# Patient Record
Sex: Female | Born: 1950 | Race: White | Hispanic: No | Smoking: Never smoker
Health system: Southern US, Community
[De-identification: ages and names within clinical notes are randomized; demographics above are authoritative.]

## PROBLEM LIST (undated history)

## (undated) DIAGNOSIS — F32A Depression, unspecified: Secondary | ICD-10-CM

## (undated) DIAGNOSIS — M1990 Unspecified osteoarthritis, unspecified site: Secondary | ICD-10-CM

## (undated) DIAGNOSIS — F329 Major depressive disorder, single episode, unspecified: Secondary | ICD-10-CM

## (undated) DIAGNOSIS — Z8489 Family history of other specified conditions: Secondary | ICD-10-CM

## (undated) DIAGNOSIS — I10 Essential (primary) hypertension: Secondary | ICD-10-CM

## (undated) DIAGNOSIS — F419 Anxiety disorder, unspecified: Secondary | ICD-10-CM

## (undated) DIAGNOSIS — K219 Gastro-esophageal reflux disease without esophagitis: Secondary | ICD-10-CM

## (undated) DIAGNOSIS — Z8719 Personal history of other diseases of the digestive system: Secondary | ICD-10-CM

## (undated) DIAGNOSIS — R011 Cardiac murmur, unspecified: Secondary | ICD-10-CM

## (undated) DIAGNOSIS — M797 Fibromyalgia: Secondary | ICD-10-CM

## (undated) HISTORY — PX: COLONOSCOPY: SHX174

## (undated) HISTORY — PX: ABDOMINAL HYSTERECTOMY: SHX81

## (undated) HISTORY — PX: EYE SURGERY: SHX253

## (undated) HISTORY — PX: CHOLECYSTECTOMY: SHX55

---
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
## 2018-04-09 ENCOUNTER — Other Ambulatory Visit: Payer: Self-pay | Admitting: Neurosurgery

## 2018-04-24 NOTE — Pre-Procedure Instructions (Signed)
Amber Snyder  04/24/2018     No Pharmacies Listed   Your procedure is scheduled on Dec. 19  [facility] at 12: 35 P.M.  Call this number if you have problems the morning of surgery:  [phone]   Remember:  Do not eat or drink after midnight.      Take these medicines the morning of surgery with A SIP OF WATER :    Do not wear jewelry, make-up or nail polish.  Do not wear lotions, powders, or perfumes, or deodorant.  Do not shave 48 hours prior to surgery.  Men may shave face and neck.  Do not bring valuables to the hospital.  [facility] is not responsible for any belongings or valuables.  Contacts, dentures or bridgework may not be worn into surgery.  Leave your suitcase in the car.  After surgery it may be brought to your room.  For patients admitted to the hospital, discharge time will be determined by your treatment team.  Patients discharged the day of surgery will not be allowed to drive home.    Special instructions:  [place]- Preparing For Surgery  Before surgery, you can play an important role. Because skin is not sterile, your skin needs to be as free of germs as possible. You can reduce the number of germs on your skin by washing with CHG (chlorahexidine gluconate) Soap before surgery.  CHG is an antiseptic cleaner which kills germs and bonds with the skin to continue killing germs even after washing.    Oral Hygiene is also important to reduce your risk of infection.  Remember - BRUSH YOUR TEETH THE MORNING OF SURGERY WITH YOUR REGULAR TOOTHPASTE  Please do not use if you have an allergy to CHG or antibacterial soaps. If your skin becomes reddened/irritated stop using the CHG.  Do not shave (including legs and underarms) for at least 48 hours prior to first CHG shower. It is OK to shave your face.  Please follow these instructions carefully.   1. Shower the NIGHT BEFORE SURGERY and the MORNING OF SURGERY with CHG.    2. If you chose to wash your hair, wash your hair first as usual with your normal shampoo.  3. After you shampoo, rinse your hair and body thoroughly to remove the shampoo.  4. Use CHG as you would any other liquid soap. You can apply CHG directly to the skin and wash gently with a scrungie or a clean washcloth.   5. Apply the CHG Soap to your body ONLY FROM THE NECK DOWN.  Do not use on open wounds or open sores. Avoid contact with your eyes, ears, mouth and genitals (private parts). Wash Face and genitals (private parts)  with your normal soap.  6. Wash thoroughly, paying special attention to the area where your surgery will be performed.  7. Thoroughly rinse your body with warm water from the neck down.  8. DO NOT shower/wash with your normal soap after using and rinsing off the CHG Soap.  9. Pat yourself dry with a CLEAN TOWEL.  10. Wear CLEAN PAJAMAS to bed the night before surgery, wear comfortable clothes the morning of surgery  11. Place CLEAN SHEETS on your bed the night of your first shower and DO NOT SLEEP WITH PETS.    Day of Surgery:  Do not apply any deodorants/lotions.  Please wear clean clothes to the hospital/surgery center.   Remember to brush your teeth WITH YOUR  REGULAR TOOTHPASTE.    Please read over the following fact sheets that you were given. Coughing and Deep Breathing, MRSA Information and Surgical Site Infection Prevention

## 2018-04-25 ENCOUNTER — Other Ambulatory Visit: Payer: Self-pay

## 2018-04-25 ENCOUNTER — Encounter (HOSPITAL_COMMUNITY)
Admission: RE | Admit: 2018-04-25 | Discharge: 2018-04-25 | Disposition: A | Discharge status: Home / Self Care | Payer: Medicare Other | Source: Ambulatory Visit | Attending: Neurosurgery | Admitting: Neurosurgery

## 2018-04-25 ENCOUNTER — Encounter (HOSPITAL_COMMUNITY): Payer: Self-pay | Admitting: Urology

## 2018-04-25 DIAGNOSIS — I119 Hypertensive heart disease without heart failure: Secondary | ICD-10-CM | POA: Insufficient documentation

## 2018-04-25 DIAGNOSIS — Z01818 Encounter for other preprocedural examination: Secondary | ICD-10-CM | POA: Insufficient documentation

## 2018-04-25 HISTORY — DX: Cardiac murmur, unspecified: R01.1

## 2018-04-25 HISTORY — DX: Family history of other specified conditions: Z84.89

## 2018-04-25 HISTORY — DX: Gastro-esophageal reflux disease without esophagitis: K21.9

## 2018-04-25 HISTORY — DX: Essential (primary) hypertension: I10

## 2018-04-25 HISTORY — DX: Personal history of other diseases of the digestive system: Z87.19

## 2018-04-25 LAB — BASIC METABOLIC PANEL
Anion gap: 12 (ref 5–15)
BUN: 17 mg/dL (ref 8–23)
CO2: 29 mmol/L (ref 22–32)
Calcium: 8.7 mg/dL — ABNORMAL LOW (ref 8.9–10.3)
Chloride: 97 mmol/L — ABNORMAL LOW (ref 98–111)
Creatinine, Ser: 0.78 mg/dL (ref 0.44–1.00)
GFR calc Af Amer: >60 mL/min (ref >60)
GFR calc non Af Amer: >60 mL/min (ref >60)
Glucose, Bld: 93 mg/dL (ref 70–99)
Potassium: 3 mmol/L — ABNORMAL LOW (ref 3.5–5.1)
Sodium: 138 mmol/L (ref 135–145)

## 2018-04-25 LAB — TYPE AND SCREEN
ABO/RH(D): O POS
Antibody Screen: NEGATIVE

## 2018-04-25 LAB — CBC
HCT: 45.1 % (ref 36.0–46.0)
Hemoglobin: 14.7 g/dL (ref 12.0–15.0)
MCH: 27.7 pg (ref 26.0–34.0)
MCHC: 32.6 g/dL (ref 30.0–36.0)
MCV: 85.1 fL (ref 80.0–100.0)
Platelets: 325 10*3/uL (ref 150–400)
RBC: 5.3 MIL/uL — ABNORMAL HIGH (ref 3.87–5.11)
RDW: 14.1 % (ref 11.5–15.5)
WBC: 7.4 10*3/uL (ref 4.0–10.5)
nRBC: 0 % (ref 0.0–0.2)

## 2018-04-25 LAB — SURGICAL PCR SCREEN
MRSA, PCR: NEGATIVE
Staphylococcus aureus: POSITIVE — AB

## 2018-04-25 LAB — ABO/RH: ABO/RH(D): O POS

## 2018-04-25 NOTE — Progress Notes (Signed)
[facility] Cardiologist - denies  Chest x-ray - 04/16/18 in care everywhere EKG - 04/25/2018   Pt states she had a cold a couple weeks ago and was on cough medicine and azithromycin. Pt states her cough has gotten better, but she still has a cough and coughs up some mucus. Pt states she will call her PCP about this and if it gets worse/ does not get better she will let Dr. Lovell SheehanJenkins know as well.   Patient denies shortness of breath, fever, and chest pain at PAT appointment   Patient verbalized understanding of instructions that were given to them at the PAT appointment. Patient was also instructed that they will need to review over the PAT instructions again at home before surgery.

## 2018-04-25 NOTE — Pre-Procedure Instructions (Signed)
Amber Snyder  04/25/2018      [facility] [name] - [name] [id number] Phone: [phone] Fax: [phone]    Your procedure is scheduled on Dec. 19  [facility] at 12:30 P.M.  Call this number if you have problems the morning of surgery:  [phone]   Remember:  Do not eat or drink after midnight.      Take these medicines the morning of surgery with A SIP OF WATER :  Amlodipine (norvasc) Esomeprazole (Nexium) Hydrocodone-Acetaminophen (Norco) if needed  7 days prior to surgery STOP taking any meloxicam (mobic), Aspirin(unless otherwise instructed by your surgeon), Aleve, Naproxen, Ibuprofen, Motrin, Advil, Goody's, BC's, all herbal medications, fish oil, and all vitamins    Do not wear jewelry, make-up or nail polish.  Do not wear lotions, powders, or perfumes, or deodorant.  Do not shave 48 hours prior to surgery.  Men may shave face and neck.  Do not bring valuables to the hospital.  [facility] responsible for any belongings or valuables.  Contacts, dentures or bridgework may not be worn into surgery.  Leave your suitcase in the car.  After surgery it may be brought to your room.  For patients admitted to the hospital, discharge time will be determined by your treatment team.  Patients discharged the day of surgery will not be allowed to drive home.    Special instructions:  [place]- Preparing For Surgery  Before surgery, you can play an important role. Because skin is not sterile, your skin needs to be as free of germs as possible. You can reduce the number of germs on your skin by washing with CHG (chlorahexidine gluconate) Soap before surgery.  CHG is an antiseptic cleaner which kills germs and bonds with the skin to continue killing germs even after washing.    Oral Hygiene is also important to reduce your risk of infection.  Remember - BRUSH YOUR TEETH THE  MORNING OF SURGERY WITH YOUR REGULAR TOOTHPASTE  Please do not use if you have an allergy to CHG or antibacterial soaps. If your skin becomes reddened/irritated stop using the CHG.  Do not shave (including legs and underarms) for at least 48 hours prior to first CHG shower. It is OK to shave your face.  Please follow these instructions carefully.   1. Shower the NIGHT BEFORE SURGERY and the MORNING OF SURGERY with CHG.   2. If you chose to wash your hair, wash your hair first as usual with your normal shampoo.  3. After you shampoo, rinse your hair and body thoroughly to remove the shampoo.  4. Use CHG as you would any other liquid soap. You can apply CHG directly to the skin and wash gently with a scrungie or a clean washcloth.   5. Apply the CHG Soap to your body ONLY FROM THE NECK DOWN.  Do not use on open wounds or open sores. Avoid contact with your eyes, ears, mouth and genitals (private parts). Wash Face and genitals (private parts)  with your normal soap.  6. Wash thoroughly, paying special attention to the area where your surgery will be performed.  7. Thoroughly rinse your body with warm water from the neck down.  8. DO NOT shower/wash with your normal soap after using and rinsing off the CHG Soap.  9. Pat yourself dry with a CLEAN TOWEL.  10. Wear CLEAN PAJAMAS to bed  the night before surgery, wear comfortable clothes the morning of surgery  11. Place CLEAN SHEETS on your bed the night of your first shower and DO NOT SLEEP WITH PETS.    Day of Surgery:  Do not apply any deodorants/lotions.  Please wear clean clothes to the hospital/surgery center.   Remember to brush your teeth WITH YOUR REGULAR TOOTHPASTE.    Please read over the following fact sheets that you were given. Coughing and Deep Breathing, MRSA Information and Surgical Site Infection Prevention

## 2018-04-30 ENCOUNTER — Other Ambulatory Visit: Payer: Self-pay | Admitting: Neurosurgery

## 2018-05-02 ENCOUNTER — Other Ambulatory Visit: Payer: Self-pay

## 2018-05-02 ENCOUNTER — Encounter (HOSPITAL_COMMUNITY): Payer: Self-pay

## 2018-05-02 ENCOUNTER — Encounter (HOSPITAL_COMMUNITY)
Admission: RE | Disposition: A | Discharge status: Home / Self Care | Payer: Self-pay | Source: Home / Self Care | Attending: Neurosurgery

## 2018-05-02 ENCOUNTER — Observation Stay (HOSPITAL_COMMUNITY)
Admission: RE | Admit: 2018-05-02 | Discharge: 2018-05-03 | Disposition: A | Discharge status: Home / Self Care | Payer: Medicare Other | Attending: Neurosurgery | Admitting: Neurosurgery

## 2018-05-02 ENCOUNTER — Ambulatory Visit (HOSPITAL_COMMUNITY): Payer: Medicare Other

## 2018-05-02 ENCOUNTER — Ambulatory Visit (HOSPITAL_COMMUNITY): Payer: Medicare Other | Admitting: Physician Assistant

## 2018-05-02 ENCOUNTER — Ambulatory Visit (HOSPITAL_COMMUNITY): Payer: Medicare Other | Admitting: Certified Registered"

## 2018-05-02 DIAGNOSIS — M4802 Spinal stenosis, cervical region: Secondary | ICD-10-CM | POA: Insufficient documentation

## 2018-05-02 DIAGNOSIS — K449 Diaphragmatic hernia without obstruction or gangrene: Secondary | ICD-10-CM | POA: Insufficient documentation

## 2018-05-02 DIAGNOSIS — Z9049 Acquired absence of other specified parts of digestive tract: Secondary | ICD-10-CM | POA: Diagnosis not present

## 2018-05-02 DIAGNOSIS — R011 Cardiac murmur, unspecified: Secondary | ICD-10-CM | POA: Diagnosis not present

## 2018-05-02 DIAGNOSIS — K219 Gastro-esophageal reflux disease without esophagitis: Secondary | ICD-10-CM | POA: Insufficient documentation

## 2018-05-02 DIAGNOSIS — Z885 Allergy status to narcotic agent status: Secondary | ICD-10-CM | POA: Diagnosis not present

## 2018-05-02 DIAGNOSIS — Z90710 Acquired absence of both cervix and uterus: Secondary | ICD-10-CM | POA: Insufficient documentation

## 2018-05-02 DIAGNOSIS — Z7951 Long term (current) use of inhaled steroids: Secondary | ICD-10-CM | POA: Insufficient documentation

## 2018-05-02 DIAGNOSIS — Z79899 Other long term (current) drug therapy: Secondary | ICD-10-CM | POA: Diagnosis not present

## 2018-05-02 DIAGNOSIS — J449 Chronic obstructive pulmonary disease, unspecified: Secondary | ICD-10-CM | POA: Diagnosis not present

## 2018-05-02 DIAGNOSIS — M4722 Other spondylosis with radiculopathy, cervical region: Secondary | ICD-10-CM | POA: Diagnosis not present

## 2018-05-02 DIAGNOSIS — M50121 Cervical disc disorder at C4-C5 level with radiculopathy: Secondary | ICD-10-CM | POA: Diagnosis present

## 2018-05-02 DIAGNOSIS — Z791 Long term (current) use of non-steroidal anti-inflammatories (NSAID): Secondary | ICD-10-CM | POA: Insufficient documentation

## 2018-05-02 DIAGNOSIS — Z419 Encounter for procedure for purposes other than remedying health state, unspecified: Secondary | ICD-10-CM

## 2018-05-02 DIAGNOSIS — Z6841 Body Mass Index (BMI) 40.0 and over, adult: Secondary | ICD-10-CM | POA: Diagnosis not present

## 2018-05-02 DIAGNOSIS — I10 Essential (primary) hypertension: Secondary | ICD-10-CM | POA: Diagnosis not present

## 2018-05-02 HISTORY — PX: ANTERIOR CERVICAL DECOMP/DISCECTOMY FUSION: SHX1161

## 2018-05-02 SURGERY — ANTERIOR CERVICAL DECOMPRESSION/DISCECTOMY FUSION 1 LEVEL
Anesthesia: General

## 2018-05-02 MED ORDER — PROMETHAZINE HCL 25 MG/ML IJ SOLN: 6.2500 mg | INTRAMUSCULAR | Status: DC | PRN

## 2018-05-02 MED ORDER — PROPOFOL 10 MG/ML IV BOLUS
INTRAVENOUS | Status: DC | PRN
  Administered 2018-05-02 (×2): 100 mg via INTRAVENOUS

## 2018-05-02 MED ORDER — SODIUM CHLORIDE 0.9 % IV SOLN
INTRAVENOUS | Status: DC | PRN
  Administered 2018-05-02: 16:00:00

## 2018-05-02 MED ORDER — THROMBIN 5000 UNITS EX SOLR
OROMUCOSAL | Status: DC | PRN
  Administered 2018-05-02: 17:00:00 via TOPICAL

## 2018-05-02 MED ORDER — CEFAZOLIN SODIUM-DEXTROSE 2-4 GM/100ML-% IV SOLN
2.0000 g | INTRAVENOUS | Status: AC
  Administered 2018-05-02: 2 g via INTRAVENOUS
  Filled 2018-05-02: qty 100

## 2018-05-02 MED ORDER — HYDROMORPHONE HCL 1 MG/ML IJ SOLN
INTRAMUSCULAR | Status: AC
  Filled 2018-05-02: qty 1

## 2018-05-02 MED ORDER — HYDROMORPHONE HCL 1 MG/ML IJ SOLN
INTRAMUSCULAR | Status: AC
  Administered 2018-05-02: 0.5 mg via INTRAVENOUS
  Filled 2018-05-02: qty 1

## 2018-05-02 MED ORDER — BISACODYL 10 MG RE SUPP: 10.0000 mg | Freq: Every day | RECTAL | Status: DC | PRN

## 2018-05-02 MED ORDER — FENTANYL CITRATE (PF) 250 MCG/5ML IJ SOLN
INTRAMUSCULAR | Status: AC
  Filled 2018-05-02: qty 5

## 2018-05-02 MED ORDER — DEXAMETHASONE SODIUM PHOSPHATE 10 MG/ML IJ SOLN
INTRAMUSCULAR | Status: DC | PRN
  Administered 2018-05-02: 10 mg via INTRAVENOUS

## 2018-05-02 MED ORDER — DEXAMETHASONE 4 MG PO TABS
4.0000 mg | ORAL_TABLET | Freq: Four times a day (QID) | ORAL | Status: AC
  Administered 2018-05-02: 4 mg via ORAL
  Filled 2018-05-02: qty 1

## 2018-05-02 MED ORDER — ALUM & MAG HYDROXIDE-SIMETH 200-200-20 MG/5ML PO SUSP: 30.0000 mL | Freq: Four times a day (QID) | ORAL | Status: DC | PRN

## 2018-05-02 MED ORDER — LACTATED RINGERS IV SOLN
INTRAVENOUS | Status: DC
  Administered 2018-05-02 (×2): via INTRAVENOUS

## 2018-05-02 MED ORDER — BACITRACIN ZINC 500 UNIT/GM EX OINT
TOPICAL_OINTMENT | CUTANEOUS | Status: DC | PRN
  Administered 2018-05-02: 1 via TOPICAL

## 2018-05-02 MED ORDER — SUGAMMADEX SODIUM 200 MG/2ML IV SOLN
INTRAVENOUS | Status: DC | PRN
  Administered 2018-05-02: 250 mg via INTRAVENOUS

## 2018-05-02 MED ORDER — MIDAZOLAM HCL 2 MG/2ML IJ SOLN
INTRAMUSCULAR | Status: AC
  Filled 2018-05-02: qty 2

## 2018-05-02 MED ORDER — ROCURONIUM BROMIDE 50 MG/5ML IV SOSY
PREFILLED_SYRINGE | INTRAVENOUS | Status: DC | PRN
  Administered 2018-05-02: 50 mg via INTRAVENOUS

## 2018-05-02 MED ORDER — PHENYLEPHRINE HCL 10 MG/ML IJ SOLN
INTRAMUSCULAR | Status: DC | PRN
  Administered 2018-05-02: 120 ug via INTRAVENOUS
  Administered 2018-05-02: 100 ug via INTRAVENOUS

## 2018-05-02 MED ORDER — LIDOCAINE 2% (20 MG/ML) 5 ML SYRINGE
INTRAMUSCULAR | Status: AC
  Filled 2018-05-02: qty 5

## 2018-05-02 MED ORDER — PROPOFOL 10 MG/ML IV BOLUS
INTRAVENOUS | Status: AC
  Filled 2018-05-02: qty 20

## 2018-05-02 MED ORDER — CHLORHEXIDINE GLUCONATE CLOTH 2 % EX PADS: 6.0000 | MEDICATED_PAD | Freq: Once | CUTANEOUS | Status: DC

## 2018-05-02 MED ORDER — LIDOCAINE HCL 1 % IJ SOLN
INTRAMUSCULAR | Status: DC | PRN
  Administered 2018-05-02: 17:00:00 via INTRADERMAL

## 2018-05-02 MED ORDER — ALBUTEROL SULFATE (2.5 MG/3ML) 0.083% IN NEBU: 2.5000 mg | INHALATION_SOLUTION | Freq: Four times a day (QID) | RESPIRATORY_TRACT | Status: DC | PRN

## 2018-05-02 MED ORDER — MENTHOL 3 MG MT LOZG: 1.0000 | LOZENGE | OROMUCOSAL | Status: DC | PRN

## 2018-05-02 MED ORDER — LIDOCAINE 2% (20 MG/ML) 5 ML SYRINGE
INTRAMUSCULAR | Status: DC | PRN
  Administered 2018-05-02: 40 mg via INTRAVENOUS

## 2018-05-02 MED ORDER — DOCUSATE SODIUM 100 MG PO CAPS
100.0000 mg | ORAL_CAPSULE | Freq: Two times a day (BID) | ORAL | Status: DC
  Administered 2018-05-02: 100 mg via ORAL
  Filled 2018-05-02: qty 1

## 2018-05-02 MED ORDER — ONDANSETRON HCL 4 MG/2ML IJ SOLN
INTRAMUSCULAR | Status: AC
  Filled 2018-05-02: qty 2

## 2018-05-02 MED ORDER — CYCLOBENZAPRINE HCL 10 MG PO TABS
10.0000 mg | ORAL_TABLET | Freq: Three times a day (TID) | ORAL | Status: DC | PRN
  Administered 2018-05-02 – 2018-05-03 (×2): 10 mg via ORAL
  Filled 2018-05-02 (×2): qty 1

## 2018-05-02 MED ORDER — ONDANSETRON HCL 4 MG/2ML IJ SOLN
INTRAMUSCULAR | Status: DC | PRN
  Administered 2018-05-02: 4 mg via INTRAVENOUS

## 2018-05-02 MED ORDER — LACTATED RINGERS IV SOLN: INTRAVENOUS | Status: DC

## 2018-05-02 MED ORDER — CEFAZOLIN SODIUM-DEXTROSE 2-4 GM/100ML-% IV SOLN
2.0000 g | Freq: Three times a day (TID) | INTRAVENOUS | Status: AC
  Administered 2018-05-02 – 2018-05-03 (×2): 2 g via INTRAVENOUS
  Filled 2018-05-02 (×2): qty 100

## 2018-05-02 MED ORDER — PHENOL 1.4 % MT LIQD
1.0000 | OROMUCOSAL | Status: DC | PRN
  Filled 2018-05-02: qty 177

## 2018-05-02 MED ORDER — SUCCINYLCHOLINE CHLORIDE 200 MG/10ML IV SOSY
PREFILLED_SYRINGE | INTRAVENOUS | Status: AC
  Filled 2018-05-02: qty 10

## 2018-05-02 MED ORDER — AMLODIPINE BESYLATE 5 MG PO TABS: 5.0000 mg | ORAL_TABLET | Freq: Every day | ORAL | Status: DC

## 2018-05-02 MED ORDER — MEPERIDINE HCL 50 MG/ML IJ SOLN: 6.2500 mg | INTRAMUSCULAR | Status: DC | PRN

## 2018-05-02 MED ORDER — FENTANYL CITRATE (PF) 100 MCG/2ML IJ SOLN
INTRAMUSCULAR | Status: DC | PRN
  Administered 2018-05-02: 150 ug via INTRAVENOUS
  Administered 2018-05-02 (×2): 50 ug via INTRAVENOUS

## 2018-05-02 MED ORDER — HYDROMORPHONE HCL 2 MG PO TABS: 4.0000 mg | ORAL_TABLET | ORAL | Status: DC | PRN

## 2018-05-02 MED ORDER — ZOLPIDEM TARTRATE 5 MG PO TABS: 5.0000 mg | ORAL_TABLET | Freq: Every evening | ORAL | Status: DC | PRN

## 2018-05-02 MED ORDER — MIDAZOLAM HCL 5 MG/5ML IJ SOLN
INTRAMUSCULAR | Status: DC | PRN
  Administered 2018-05-02: 2 mg via INTRAVENOUS

## 2018-05-02 MED ORDER — ACETAMINOPHEN 325 MG PO TABS: 650.0000 mg | ORAL_TABLET | ORAL | Status: DC | PRN

## 2018-05-02 MED ORDER — POTASSIUM 99 MG PO TABS: 1.0000 | ORAL_TABLET | ORAL | Status: DC | PRN

## 2018-05-02 MED ORDER — ROCURONIUM BROMIDE 50 MG/5ML IV SOSY
PREFILLED_SYRINGE | INTRAVENOUS | Status: AC
  Filled 2018-05-02: qty 5

## 2018-05-02 MED ORDER — SODIUM CHLORIDE 0.9 % IV SOLN
INTRAVENOUS | Status: DC | PRN
  Administered 2018-05-02: 15 ug/min via INTRAVENOUS

## 2018-05-02 MED ORDER — HYDROCHLOROTHIAZIDE 25 MG PO TABS
25.0000 mg | ORAL_TABLET | Freq: Every day | ORAL | Status: DC
  Administered 2018-05-02: 25 mg via ORAL
  Filled 2018-05-02: qty 1

## 2018-05-02 MED ORDER — PANTOPRAZOLE SODIUM 40 MG PO TBEC: 80.0000 mg | DELAYED_RELEASE_TABLET | Freq: Every day | ORAL | Status: DC

## 2018-05-02 MED ORDER — HYDROMORPHONE HCL 1 MG/ML IJ SOLN
0.2500 mg | INTRAMUSCULAR | Status: DC | PRN
  Administered 2018-05-02 (×4): 0.5 mg via INTRAVENOUS

## 2018-05-02 MED ORDER — ONDANSETRON HCL 4 MG/2ML IJ SOLN: 4.0000 mg | Freq: Four times a day (QID) | INTRAMUSCULAR | Status: DC | PRN

## 2018-05-02 MED ORDER — MORPHINE SULFATE (PF) 4 MG/ML IV SOLN: 4.0000 mg | INTRAVENOUS | Status: DC | PRN

## 2018-05-02 MED ORDER — PHENYLEPHRINE 40 MCG/ML (10ML) SYRINGE FOR IV PUSH (FOR BLOOD PRESSURE SUPPORT)
PREFILLED_SYRINGE | INTRAVENOUS | Status: AC
  Filled 2018-05-02: qty 20

## 2018-05-02 MED ORDER — HYDROCODONE-ACETAMINOPHEN 10-325 MG PO TABS
1.0000 | ORAL_TABLET | ORAL | Status: DC | PRN
  Administered 2018-05-02 – 2018-05-03 (×4): 1 via ORAL
  Filled 2018-05-02 (×4): qty 1

## 2018-05-02 MED ORDER — ACETAMINOPHEN 500 MG PO TABS
1000.0000 mg | ORAL_TABLET | Freq: Four times a day (QID) | ORAL | Status: DC
  Administered 2018-05-02: 1000 mg via ORAL
  Administered 2018-05-02 – 2018-05-03 (×2): 500 mg via ORAL
  Filled 2018-05-02 (×3): qty 2

## 2018-05-02 MED ORDER — DEXAMETHASONE SODIUM PHOSPHATE 4 MG/ML IJ SOLN
4.0000 mg | Freq: Four times a day (QID) | INTRAMUSCULAR | Status: AC
  Administered 2018-05-02: 4 mg via INTRAVENOUS
  Filled 2018-05-02: qty 1

## 2018-05-02 MED ORDER — BACITRACIN ZINC 500 UNIT/GM EX OINT
TOPICAL_OINTMENT | CUTANEOUS | Status: AC
  Filled 2018-05-02: qty 28.35

## 2018-05-02 MED ORDER — DEXAMETHASONE SODIUM PHOSPHATE 10 MG/ML IJ SOLN
INTRAMUSCULAR | Status: AC
  Filled 2018-05-02: qty 1

## 2018-05-02 MED ORDER — MIDAZOLAM HCL 2 MG/2ML IJ SOLN: 0.5000 mg | Freq: Once | INTRAMUSCULAR | Status: DC | PRN

## 2018-05-02 MED ORDER — THROMBIN 5000 UNITS EX SOLR
CUTANEOUS | Status: AC
  Filled 2018-05-02: qty 5000

## 2018-05-02 MED ORDER — CLONAZEPAM 1 MG PO TABS: 1.0000 mg | ORAL_TABLET | Freq: Two times a day (BID) | ORAL | Status: DC | PRN

## 2018-05-02 MED ORDER — BUPIVACAINE-EPINEPHRINE (PF) 0.25% -1:200000 IJ SOLN
INTRAMUSCULAR | Status: AC
  Filled 2018-05-02: qty 30

## 2018-05-02 MED ORDER — ONDANSETRON HCL 4 MG PO TABS: 4.0000 mg | ORAL_TABLET | Freq: Four times a day (QID) | ORAL | Status: DC | PRN

## 2018-05-02 MED ORDER — 0.9 % SODIUM CHLORIDE (POUR BTL) OPTIME
TOPICAL | Status: DC | PRN
  Administered 2018-05-02: 1000 mL

## 2018-05-02 MED ORDER — ACETAMINOPHEN 650 MG RE SUPP: 650.0000 mg | RECTAL | Status: DC | PRN

## 2018-05-02 MED ORDER — PANTOPRAZOLE SODIUM 40 MG IV SOLR
40.0000 mg | Freq: Every day | INTRAVENOUS | Status: DC
  Administered 2018-05-02: 40 mg via INTRAVENOUS
  Filled 2018-05-02: qty 40

## 2018-05-02 SURGICAL SUPPLY — 58 items
BAG DECANTER FOR FLEXI CONT (MISCELLANEOUS) ×2 IMPLANT
BENZOIN TINCTURE PRP APPL 2/3 (GAUZE/BANDAGES/DRESSINGS) ×2 IMPLANT
BIT DRILL NEURO 2X3.1 SFT TUCH (MISCELLANEOUS) ×1 IMPLANT
BLADE SURG 15 STRL LF DISP TIS (BLADE) ×1 IMPLANT
BLADE SURG 15 STRL SS (BLADE) ×1
BLADE ULTRA TIP 2M (BLADE) ×2 IMPLANT
BUR BARREL STRAIGHT FLUTE 4.0 (BURR) ×2 IMPLANT
BUR MATCHSTICK NEURO 3.0 LAGG (BURR) ×2 IMPLANT
CAGE PEEK VISTAS 11X14X6 (Cage) ×2 IMPLANT
CANISTER SUCT 3000ML PPV (MISCELLANEOUS) ×2 IMPLANT
CARTRIDGE OIL MAESTRO DRILL (MISCELLANEOUS) ×1 IMPLANT
COVER MAYO STAND STRL (DRAPES) ×2 IMPLANT
COVER WAND RF STERILE (DRAPES) ×2 IMPLANT
DECANTER SPIKE VIAL GLASS SM (MISCELLANEOUS) ×2 IMPLANT
DIFFUSER DRILL AIR PNEUMATIC (MISCELLANEOUS) ×2 IMPLANT
DRAPE LAPAROTOMY 100X72 PEDS (DRAPES) ×2 IMPLANT
DRAPE MICROSCOPE LEICA (MISCELLANEOUS) IMPLANT
DRAPE POUCH INSTRU U-SHP 10X18 (DRAPES) ×2 IMPLANT
DRAPE SURG 17X23 STRL (DRAPES) ×4 IMPLANT
DRILL NEURO 2X3.1 SOFT TOUCH (MISCELLANEOUS) ×2
DRSG OPSITE POSTOP 4X6 (GAUZE/BANDAGES/DRESSINGS) ×2 IMPLANT
ELECT REM PT RETURN 9FT ADLT (ELECTROSURGICAL) ×2
ELECTRODE REM PT RTRN 9FT ADLT (ELECTROSURGICAL) ×1 IMPLANT
GAUZE 4X4 16PLY RFD (DISPOSABLE) IMPLANT
GAUZE SPONGE 4X4 12PLY STRL (GAUZE/BANDAGES/DRESSINGS) ×2 IMPLANT
GLOVE BIO SURGEON STRL SZ 6.5 (GLOVE) ×2 IMPLANT
GLOVE BIO SURGEON STRL SZ8 (GLOVE) ×2 IMPLANT
GLOVE BIO SURGEON STRL SZ8.5 (GLOVE) ×2 IMPLANT
GLOVE BIOGEL PI IND STRL 6.5 (GLOVE) ×1 IMPLANT
GLOVE BIOGEL PI INDICATOR 6.5 (GLOVE) ×1
GLOVE EXAM NITRILE XL STR (GLOVE) IMPLANT
GOWN STRL REUS W/ TWL LRG LVL3 (GOWN DISPOSABLE) IMPLANT
GOWN STRL REUS W/ TWL XL LVL3 (GOWN DISPOSABLE) ×1 IMPLANT
GOWN STRL REUS W/TWL LRG LVL3 (GOWN DISPOSABLE)
GOWN STRL REUS W/TWL XL LVL3 (GOWN DISPOSABLE) ×1
HEMOSTAT POWDER KIT SURGIFOAM (HEMOSTASIS) ×2 IMPLANT
KIT BASIN OR (CUSTOM PROCEDURE TRAY) ×2 IMPLANT
KIT TURNOVER KIT B (KITS) ×2 IMPLANT
MARKER SKIN DUAL TIP RULER LAB (MISCELLANEOUS) ×2 IMPLANT
NEEDLE HYPO 22GX1.5 SAFETY (NEEDLE) ×2 IMPLANT
NEEDLE SPNL 18GX3.5 QUINCKE PK (NEEDLE) ×2 IMPLANT
NS IRRIG 1000ML POUR BTL (IV SOLUTION) ×2 IMPLANT
OIL CARTRIDGE MAESTRO DRILL (MISCELLANEOUS) ×2
PACK LAMINECTOMY NEURO (CUSTOM PROCEDURE TRAY) ×2 IMPLANT
PIN DISTRACTION 14MM (PIN) ×4 IMPLANT
PLATE ANT CERV XTEND LD 1 L10 (Plate) ×2 IMPLANT
PUTTY DBM 2CC CALC GRAN (Putty) ×2 IMPLANT
RUBBERBAND STERILE (MISCELLANEOUS) IMPLANT
SCREW XTD VAR 4.2 SELF TAP 12 (Screw) ×8 IMPLANT
SPONGE INTESTINAL PEANUT (DISPOSABLE) ×4 IMPLANT
SPONGE SURGIFOAM ABS GEL SZ50 (HEMOSTASIS) IMPLANT
STRIP CLOSURE SKIN 1/2X4 (GAUZE/BANDAGES/DRESSINGS) ×2 IMPLANT
SUT VIC AB 0 CT1 27 (SUTURE) ×1
SUT VIC AB 0 CT1 27XBRD ANTBC (SUTURE) ×1 IMPLANT
SUT VIC AB 3-0 SH 8-18 (SUTURE) ×2 IMPLANT
TOWEL GREEN STERILE (TOWEL DISPOSABLE) ×2 IMPLANT
TOWEL GREEN STERILE FF (TOWEL DISPOSABLE) ×2 IMPLANT
WATER STERILE IRR 1000ML POUR (IV SOLUTION) ×2 IMPLANT

## 2018-05-02 NOTE — Anesthesia Procedure Notes (Signed)
Procedure Name: Intubation Date/Time: 05/02/2018 3:13 PM Performed by: Oletta Lamas, CRNA Pre-anesthesia Checklist: Patient identified, Emergency Drugs available, Suction available and Patient being monitored Patient Re-evaluated:Patient Re-evaluated prior to induction Oxygen Delivery Method: Circle System Utilized Preoxygenation: Pre-oxygenation with 100% oxygen Induction Type: IV induction Ventilation: Mask ventilation without difficulty Laryngoscope Size: Mac and 4 Grade View: Grade I Tube type: Oral Number of attempts: 1 Airway Equipment and Method: Stylet and Oral airway Placement Confirmation: ETT inserted through vocal cords under direct vision,  positive ETCO2 and breath sounds checked- equal and bilateral Secured at: 21 cm Tube secured with: Tape Dental Injury: Teeth and Oropharynx as per pre-operative assessment

## 2018-05-02 NOTE — Progress Notes (Signed)
Subjective: The patient is somnolent but arousable.  She is in no apparent distress.  Objective: Vital signs in last 24 hours: Temp:  [97.7 F (36.5 C)-98.5 F (36.9 C)] 98.5 F (36.9 C) (12/19 1710) Pulse Rate:  [73-88] 73 (12/19 1738) Resp:  [13-21] 13 (12/19 1738) BP: (141-157)/(67-78) 141/67 (12/19 1738) SpO2:  [86 %-100 %] 96 % (12/19 1738) Weight:  [108.9 kg] 108.9 kg (12/19 1246) Estimated body mass index is 42.51 kg/m as calculated from the following:   Height as of this encounter: 5\' 3"  (1.6 m).   Weight as of this encounter: 108.9 kg.   Intake/Output from previous day: No intake/output data recorded. Intake/Output this shift: Total I/O In: -  Out: 100 [Blood:100]  Physical exam the patient is somnolent but arousable.  Her strength is normal.  Her dressing is clean and dry the incision is minimally swollen.  Her neck is symmetrically large.  There is no hematoma or shift.  Lab Results: No results for input(s): WBC, HGB, HCT, PLT in the last 72 hours. BMET No results for input(s): NA, K, CL, CO2, GLUCOSE, BUN, CREATININE, CALCIUM in the last 72 hours.  Studies/Results: No results found.  Assessment/Plan: The patient is doing well.  I spoke with her daughter.  LOS: 0 days     Cristi LoronJeffrey D Isaul Landi 05/02/2018, 5:42 PM

## 2018-05-02 NOTE — Anesthesia Preprocedure Evaluation (Addendum)
Anesthesia Evaluation  Patient identified by MRN, date of birth, ID band Patient awake    Reviewed: Allergy & Precautions, NPO status , Patient's Chart, lab work & pertinent test results  History of Anesthesia Complications Negative for: history of anesthetic complications  Airway Mallampati: II  TM Distance: >3 FB Neck ROM: Full    Dental  (+) Dental Advisory Given   Pulmonary COPD,  COPD inhaler, Recent URI , Resolved,    breath sounds clear to auscultation       Cardiovascular hypertension, Pt. on medications (-) angina Rhythm:Regular Rate:Normal     Neuro/Psych Cervical radiculopathy    GI/Hepatic Neg liver ROS, GERD  Medicated and Controlled,  Endo/Other  Morbid obesity  Renal/GU negative Renal ROS     Musculoskeletal   Abdominal (+) + obese,   Peds  Hematology negative hematology ROS (+)   Anesthesia Other Findings   Reproductive/Obstetrics                            Anesthesia Physical Anesthesia Plan  ASA: II  Anesthesia Plan: General   Post-op Pain Management:    Induction: Intravenous  PONV Risk Score and Plan: 4 or greater and Ondansetron, Dexamethasone and Treatment may vary due to age or medical condition  Airway Management Planned: Oral ETT  Additional Equipment:   Intra-op Plan:   Post-operative Plan: Extubation in OR  Informed Consent: I have reviewed the patients History and Physical, chart, labs and discussed the procedure including the risks, benefits and alternatives for the proposed anesthesia with the patient or authorized representative who has indicated his/her understanding and acceptance.   Dental advisory given  Plan Discussed with: CRNA and Surgeon  Anesthesia Plan Comments: (Plan routine monitors, GETA)       Anesthesia Quick Evaluation

## 2018-05-02 NOTE — Op Note (Signed)
Brief history: The patient is a 67 year old white female who has complained of neck and left shoulder pain consistent with a cervical radiculopathy.  She has failed medical management.  She was worked up with a cervical MRI.  This demonstrated degeneration, spondylosis, stenosis at C4-5.  I discussed the various treatment options with her including surgery.  She has weighed the risks, benefits and alternatives of surgery and decided to proceed with a C4-5 anterior cervical discectomy, fusion and plating.  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 Delma OfficerJeff Azura Tufaro  Asst.: Hildred PriestMegan Bergman nurse practitioner  Anesthesia: Gen. endotracheal  Estimated blood loss: 50 cc  Drains: None  Complications: None  Description of procedure: The patient was brought to the operating room by the anesthesia team. General endotracheal anesthesia was induced. A roll was placed under the patient's shoulders to keep the neck in the neutral position. The patient's anterior cervical region was then prepared with Betadine scrub and Betadine solution. Sterile drapes were applied.  The area to be incised was then injected with Marcaine with epinephrine solution. I then used a scalpel to make a transverse incision in the patient's left anterior neck. I used the Metzenbaum scissors to divide the platysmal muscle and then to dissect medial to the sternocleidomastoid muscle, jugular vein, and carotid artery. I carefully dissected down towards the anterior cervical spine identifying the esophagus and retracting it medially. Then  using Kitner swabs to clear soft tissue from the anterior cervical spine. We then inserted a bent spinal needle into the upper exposed intervertebral disc space. We then obtained intraoperative radiographs confirm our location.  I then used electrocautery to detach the medial border of the longus colli muscle bilaterally from the C4-5 intervertebral disc spaces. I then inserted the Caspar self-retaining retractor underneath the longus colli muscle bilaterally to provide exposure.  We then incised the intervertebral disc at C4-5. We then performed a partial intervertebral discectomy with a pituitary forceps and the Karlin curettes. I then inserted distraction screws into the vertebral bodies at C4-5. We then distracted the interspace. We then used the high-speed drill to decorticate the vertebral endplates at C4-5, to drill away the remainder of the intervertebral disc, to drill away some posterior spondylosis, and to thin out the posterior longitudinal ligament. I then incised ligament with the arachnoid knife. We then removed the ligament with a Kerrison punches undercutting the vertebral endplates and decompressing the thecal sac. We then performed foraminotomies about the bilateral C5 nerve roots. This completed the decompression at this level.  We now turned our to attention to the interbody fusion. We used the trial spacers to determine the appropriate size for the interbody prosthesis. We then pre-filled prosthesis with a combination of local morcellized autograft bone that we obtained during decompression as well as Kinnex bone graft extender. We then inserted the prosthesis into the distracted interspace at C4-5. We then removed the distraction screws. There was a good snug fit of the prosthesis in the interspace.  Having completed the fusion we now turned attention to the anterior spinal instrumentation. We used the high-speed drill to drill away some anterior spondylosis at the disc spaces so that  the plate lay down flat. We selected the appropriate length  titanium anterior cervical plate. We laid it along the anterior aspect of the vertebral bodies from C4-5. We then drilled 12 mm holes at C4 and C5. We then secured the plate to the vertebral bodies by placing two 12 mm self-tapping screws at C4 and C5. We then obtained intraoperative radiograph. The demonstrating good position of the instrumentation. We therefore secured the screws the plate the locking each cam. This completed the instrumentation.  We then obtained hemostasis using bipolar electrocautery. We irrigated the wound out with bacitracin solution. We then removed the retractor. We inspected the esophagus for any damage. There was none apparent. We then reapproximated patient's platysmal muscle with interrupted 3-0 Vicryl suture. We then reapproximated the subcutaneous tissue with interrupted 3-0 Vicryl suture. The skin was reapproximated with Steri-Strips and benzoin. The wound was then covered with bacitracin ointment. A sterile dressing was applied. The drapes were removed. Patient was subsequently extubated by the anesthesia team and transported to the post anesthesia care unit in stable condition. All sponge instrument and needle counts were reportedly correct at the end of this case.

## 2018-05-02 NOTE — Transfer of Care (Signed)
Immediate Anesthesia Transfer of Care Note  Patient: Alease FrameMargaret E Lafitte  Procedure(s) Performed: CERVICAL FOUR-FIVE ANTERIOR CERVICAL DECOMPRESSION/DISCECTOMY FUSION, INTERBODY PROSTHESIS AND ANTERIOR PLATING (N/A )  Patient Location: PACU  Anesthesia Type:General  Level of Consciousness: awake, alert , oriented and patient cooperative  Airway & Oxygen Therapy: Patient Spontanous Breathing and Patient connected to nasal cannula oxygen  Post-op Assessment: Report given to RN and Post -op Vital signs reviewed and stable  Post vital signs: Reviewed and stable  Last Vitals:  Vitals Value Taken Time  BP 143/68 05/02/2018  5:10 PM  Temp 36.9 C 05/02/2018  5:10 PM  Pulse 82 05/02/2018  5:15 PM  Resp 20 05/02/2018  5:15 PM  SpO2 93 % 05/02/2018  5:15 PM  Vitals shown include unvalidated device data.  Last Pain:  Vitals:   05/02/18 1710  TempSrc:   PainSc: 0-No pain      Patients Stated Pain Goal: 2 (05/02/18 1322)  Complications: No apparent anesthesia complications
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
## 2018-05-02 NOTE — Anesthesia Postprocedure Evaluation (Signed)
Anesthesia Post Note  Patient: Alease FrameMargaret E Asbill  Procedure(s) Performed: CERVICAL FOUR-FIVE ANTERIOR CERVICAL DECOMPRESSION/DISCECTOMY FUSION, INTERBODY PROSTHESIS AND ANTERIOR PLATING (N/A )     Patient location during evaluation: PACU Anesthesia Type: General Level of consciousness: awake and alert, patient cooperative and oriented Pain management: pain level controlled Vital Signs Assessment: post-procedure vital signs reviewed and stable Respiratory status: spontaneous breathing, nonlabored ventilation and respiratory function stable Cardiovascular status: blood pressure returned to baseline and stable Postop Assessment: no apparent nausea or vomiting Anesthetic complications: no    Last Vitals:  Vitals:   05/02/18 1808 05/02/18 1810  BP: 128/65   Pulse: 73   Resp: 15   Temp:  36.5 C  SpO2: 94%     Last Pain:  Vitals:   05/02/18 1810  TempSrc:   PainSc: 4                  Lynnetta Tom,E. Arriel Victor

## 2018-05-03 DIAGNOSIS — M50121 Cervical disc disorder at C4-C5 level with radiculopathy: Secondary | ICD-10-CM | POA: Diagnosis not present

## 2018-05-03 NOTE — Discharge Instructions (Signed)
Wound Care °Leave incision open to air. °You may shower. °Do not scrub directly on incision.  °Do not put any creams, lotions, or ointments on incision. °Activity °Walk each and every day, increasing distance each day. °No lifting greater than 5 lbs.  Avoid excessive neck motion. °No driving for 2 weeks; may ride as a passenger locally. °Wear neck brace at all times except when showering.  If provided soft collar, may wear for comfort unless otherwise instructed. °Diet °Resume your normal diet.  °Return to Work °Will be discussed at you follow up appointment. °Call Your Doctor If Any of These Occur °Redness, drainage, or swelling at the wound.  °Temperature greater than 101 degrees. °Severe pain not relieved by pain medication. °Increased difficulty swallowing. °Incision starts to come apart. °Follow Up Appt °Call today for appointment in 1-2 weeks ([phone]) or for problems.  If you have any hardware placed in your spine, you will need an x-ray before your appointment. °

## 2018-05-03 NOTE — Evaluation (Signed)
Occupational Therapy Evaluation Patient Details Name: Amber FrameMargaret E Rolfe MRN: [id number] DOB: 02/04/1951 Today's Date: 05/03/2018    History of Present Illness This 67 y.o. female admitted for C4-5 ACDF.  PMH non contributory    Clinical Impression   Patient evaluated by Occupational Therapy with no further acute OT needs identified. All education has been completed and the patient has no further questions. Pt is able to perform ADLs at supervision level and should quickly progress to mod I.  Pt was instructed in HEP for Rt shoulder.   See below for any follow-up Occupational Therapy or equipment needs. OT is signing off. Thank you for this referral.      Follow Up Recommendations  Supervision/Assistance - 24 hour    Equipment Recommendations  None recommended by OT    Recommendations for Other Services       Precautions / Restrictions Precautions Precautions: Cervical Precaution Booklet Issued: Yes (comment) Precaution Comments: Pt provided with cervical precautions handout and these were reviewed with pt  Required Braces or Orthoses: Cervical Brace Cervical Brace: Hard collar;At all times(off for shower, and while in bed )      Mobility Bed Mobility Overal bed mobility: Needs Assistance Bed Mobility: Rolling;Sidelying to Sit;Sit to Sidelying Rolling: Supervision Sidelying to sit: Supervision     Sit to sidelying: Supervision General bed mobility comments: Pt required cueing for technique.  She has a very high bed, and may choose to sleep in recliner.  Instructed her to don collar before attempting bed mobility as she struggles to maintain precautions during bed mobiliyy   Transfers Overall transfer level: Modified independent                    Balance Overall balance assessment: Mild deficits observed, not formally tested                                         ADL either performed or assessed with clinical judgement   ADL Overall ADL's  : Needs assistance/impaired Eating/Feeding: Independent   Grooming: Wash/dry hands;Wash/dry face;Oral care;Brushing hair;Supervision/safety;Standing Grooming Details (indicate cue type and reason): reviewed safe technique for oral care  Upper Body Bathing: Set up;Supervision/ safety;Sitting;Standing   Lower Body Bathing: Supervison/ safety;Sit to/from stand   Upper Body Dressing : Set up;Sitting   Lower Body Dressing: Min guard;Sit to/from stand Lower Body Dressing Details (indicate cue type and reason): able to cross ankles over knees  Toilet Transfer: Supervision/safety;Ambulation;Regular Toilet;Grab bars   Toileting- Clothing Manipulation and Hygiene: Supervision/safety;Sit to/from stand       Functional mobility during ADLs: Modified independent General ADL Comments: reviewed safety with IADLs, lifting restrictions and use of reacher discussed ergonomics for when pt returns to work      Vision Baseline Vision/History: Wears glasses Wears Glasses: At all times Patient Visual Report: No change from baseline       Perception     Praxis      Pertinent Vitals/Pain Pain Assessment: 0-10 Pain Score: 5  Pain Location: neck  Pain Descriptors / Indicators: Operative site guarding Pain Intervention(s): Monitored during session     Hand Dominance Right   Extremity/Trunk Assessment Upper Extremity Assessment Upper Extremity Assessment: RUE deficits/detail RUE Deficits / Details: grossly 4-/5 pt reports pain with end range motion    Lower Extremity Assessment Lower Extremity Assessment: Overall WFL for tasks assessed       Communication  Communication Communication: No difficulties   Cognition Arousal/Alertness: Awake/alert Behavior During Therapy: WFL for tasks assessed/performed Overall Cognitive Status: Within Functional Limits for tasks assessed                                     General Comments  Pt able to don/doff collar mod I after  instruction provided     Exercises Exercises: Other exercises Other Exercises Other Exercises: Pt instructed in AROM shoulder flexion, abduction and external rotation and able to return demonstration    Shoulder Instructions      Home Living Family/patient expects to be discharged to:: Private residence Living Arrangements: Children;Other relatives Available Help at Discharge: Family;Available 24 hours/day Type of Home: House             Bathroom Shower/Tub: Chief Strategy OfficerTub/shower unit   Bathroom Toilet: Standard                Prior Functioning/Environment Level of Independence: Independent        Comments: Pt works at a [employer]         OT Problem List: Pain;Impaired UE functional use;Decreased activity tolerance;Decreased knowledge of precautions      OT Treatment/Interventions:      OT Goals(Current goals can be found in the care plan section) Acute Rehab OT Goals Patient Stated Goal: To get back to work and have no pain  OT Goal Formulation: All assessment and education complete, DC therapy  OT Frequency:     Barriers to D/C:            Co-evaluation              AM-PAC OT "6 Clicks" Daily Activity     Outcome Measure Help from another person eating meals?: None Help from another person taking care of personal grooming?: None Help from another person toileting, which includes using toliet, bedpan, or urinal?: None Help from another person bathing (including washing, rinsing, drying)?: None Help from another person to put on and taking off regular upper body clothing?: None Help from another person to put on and taking off regular lower body clothing?: None 6 Click Score: 24   End of Session Equipment Utilized During Treatment: Cervical collar Nurse Communication: Mobility status  Activity Tolerance: Patient tolerated treatment well Patient left: in bed;with call bell/phone within reach  OT Visit Diagnosis: Pain Pain - part of body: (neck )                 Time: [id number] OT Time Calculation (min): 19 min Charges:  OT General Charges $OT Visit: 1 Visit OT Evaluation $OT Eval Low Complexity: 1 Low  Jeani HawkingWendi Michai Dieppa, OTR/L Acute Rehabilitation Services Pager [phone] Office [phone]   Jeani HawkingConarpe, Yamilka Lopiccolo M 05/03/2018, 10:15 AM

## 2018-05-03 NOTE — Plan of Care (Signed)
  Problem: Bowel/Gastric: Goal: Gastrointestinal status for postoperative course will improve Outcome: Completed/Met   Problem: Clinical Measurements: Goal: Ability to maintain clinical measurements within normal limits will improve Outcome: Completed/Met Goal: Postoperative complications will be avoided or minimized Outcome: Completed/Met Goal: Diagnostic test results will improve Outcome: Completed/Met   Problem: Health Behavior/Discharge Planning: Goal: Identification of resources available to assist in meeting health care needs will improve Outcome: Completed/Met   Problem: Safety: Goal: Ability to remain free from injury will improve Outcome: Completed/Met

## 2018-05-03 NOTE — Progress Notes (Signed)
Discharge instructions, RX's and follow up appts explained and provided to patient verbalized understanding. Patient left floor via wheel chair accompanied by staff.   Gabriella Guile Lynn, RN  

## 2018-05-03 NOTE — Discharge Summary (Signed)
Physician Discharge Summary  Patient ID: Amber Snyder MRN: [id number] DOB/AGE: 68/01/1951 68 y.o.  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  The surgery went well.  The patient's postoperative course was unremarkable.  On postoperative day 1 the patient felt much better and requested discharge to home.  She was given written and oral discharge instructions.  All her questions were answered.  Consults: Occupational Therapy Significant Diagnostic Studies: None Treatments: C4-5 anterior cervical discectomy, fusion and plating Discharge Exam: Blood pressure 119/67, pulse 80, temperature 97.9 F (36.6 C), temperature source Oral, resp. rate 16, height 5\' 3"  (1.6 m), weight 108.9 kg, SpO2 93 %. The patient is alert and pleasant.  She looks well.  Her dressing is clean and dry.  There is no hematoma or shift.  Her strength is normal.  Disposition: Home  Discharge Instructions    Call MD for:  difficulty breathing, headache or visual disturbances   Complete by:  As directed    Call MD for:  extreme fatigue   Complete by:  As directed    Call MD for:  hives   Complete by:  As directed    Call MD for:  persistant dizziness or light-headedness   Complete by:  As directed    Call MD for:  persistant nausea and vomiting   Complete by:  As directed    Call MD for:  redness, tenderness, or signs of infection (pain, swelling, redness, odor or green/yellow discharge around incision site)   Complete by:  As directed    Call MD for:  severe uncontrolled pain   Complete by:  As directed    Call MD for:  temperature >100.4   Complete  by:  As directed    Diet - low sodium heart healthy   Complete by:  As directed    Discharge instructions   Complete by:  As directed    Call [phone] for a followup appointment. Take a stool softener while you are using pain medications.   Driving Restrictions   Complete by:  As directed    Do not drive for 2 weeks.   Increase activity slowly   Complete by:  As directed    Lifting restrictions   Complete by:  As directed    Do not lift more than 5 pounds. No excessive bending or twisting.   May shower / Bathe   Complete by:  As directed    Remove the dressing for 3 days after surgery.  You may shower, but leave the incision alone.   Remove dressing in 48 hours   Complete by:  As directed    Your stitches are under the scan and will dissolve by themselves. The Steri-Strips will fall off after you take a few showers. Do not rub back or pick at the wound, Leave the wound alone.     Allergies as of 05/03/2018      Reactions   Percocet [oxycodone-acetaminophen] Rash   Broke out in rash on entire back       Medication List    STOP taking these medications   meloxicam 15 MG tablet Commonly known as:  MOBIC     TAKE these medications   amLODipine  5 MG tablet Commonly known as:  NORVASC Take 5 mg by mouth daily.   clonazePAM 1 MG tablet Commonly known as:  KLONOPIN Take 1 mg by mouth daily as needed for anxiety.   esomeprazole 40 MG capsule Commonly known as:  NEXIUM Take 40 mg by mouth daily.   hydrochlorothiazide 25 MG tablet Commonly known as:  HYDRODIURIL Take 25 mg by mouth daily.   HYDROcodone-acetaminophen 10-325 MG tablet Commonly known as:  NORCO Take 1 tablet by mouth every 6 (six) hours as needed.   Potassium 99 MG Tabs Take 1 tablet by mouth as needed (feeling tired).   PROAIR HFA 108 (90 Base) MCG/ACT inhaler Generic drug:  albuterol Inhale 2 puffs into the lungs every 6 (six) hours as needed for cough.   SALONPAS EX Apply topically. Patch  when needed        Signed: Cristi LoronJeffrey D Phallon Haydu 05/03/2018, 7:50 AM

## 2018-05-05 ENCOUNTER — Encounter (HOSPITAL_COMMUNITY): Payer: Self-pay | Admitting: Neurosurgery

## 2018-11-06 HISTORY — PX: JOINT REPLACEMENT: SHX530

## 2019-03-06 NOTE — Patient Instructions (Addendum)
DUE TO COVID-19 ONLY ONE VISITOR IS ALLOWED TO COME WITH YOU AND STAY IN THE WAITING ROOM ONLY DURING PRE OP AND PROCEDURE DAY OF SURGERY. THE 1 VISITOR MAY VISIT WITH YOU AFTER SURGERY IN YOUR PRIVATE ROOM DURING VISITING HOURS ONLY!  YOU HAD A COVID 19 TEST ON 03-07-19. PLEASE CONTINUE THE QUARANTINE INSTRUCTIONS AS OUTLINED IN YOUR HANDOUT.                MIKAIA JANVIER  03/06/2019   Your procedure is scheduled on:    [facility] Main  Entr[facility]    Report to Admitting at 10:30  AM     Call this number if you have problems the morning of surgery [phone]    Remember:AFTER MIDNIGHT THE NIGHT PRIOR TO SURGERY. NOTHING EXCEPT CLEAR LIQUIDS UNTIL 10:00 AM . PLEASE FINISH ENSURE DRINK PER SURGEON ORDER  WHICH NEEDS TO BE COMPLETED AT 10:00 AM .   CLEAR LIQUID DIET   Foods Allowed                                                                     Foods Excluded  Coffee and tea, regular and decaf                             liquids that you cannot  Plain Jell-O any favor except red or purple                                           see through such as: Fruit ices (not with fruit pulp)                                     milk, soups, orange juice  Iced Popsicles                                    All solid food Carbonated beverages, regular and diet                                    Cranberry, grape and apple juices Sports drinks like Gatorade Lightly seasoned clear broth or consume(fat free) Sugar, honey syrup   _____________________________________________________________________       Take these medicines the morning of surgery with A SIP OF WATER: Amlodipine (Norvasc), Esomeprazole (Nexium), and Norco, prn  BRUSH YOUR TEETH MORNING OF SURGERY AND RINSE YOUR MOUTH OUT, NO CHEWING GUM CANDY OR MINTS.                                 You may not have any metal on your body including hair pins and              piercings     Do not wear jewelry,  make-up, lotions, powders or perfumes,  deodorant              Do not wear nail polish on your fingernails.  Do not shave  48 hours prior to surgery.                 Do not bring valuables to the hospital. [place].  Contacts, dentures or bridgework may not be worn into surgery.  You may bring an overnight bag.     Special Instructions: N/A              Please read over the following fact sheets you were given: _____________________________________________________________________             [facility] - Preparing for Surgery Before surgery, you can play an important role.  Because skin is not sterile, your skin needs to be as free of germs as possible.  You can reduce the number of germs on your skin by washing with CHG (chlorahexidine gluconate) soap before surgery.  CHG is an antiseptic cleaner which kills germs and bonds with the skin to continue killing germs even after washing. Please DO NOT use if you have an allergy to CHG or antibacterial soaps.  If your skin becomes reddened/irritated stop using the CHG and inform your nurse when you arrive at Short Stay. Do not shave (including legs and underarms) for at least 48 hours prior to the first CHG shower.  You may shave your face/neck. Please follow these instructions carefully:  1.  Shower with CHG Soap the night before surgery and the  morning of Surgery.  2.  If you choose to wash your hair, wash your hair first as usual with your  normal  shampoo.  3.  After you shampoo, rinse your hair and body thoroughly to remove the  shampoo.                           4.  Use CHG as you would any other liquid soap.  You can apply chg directly  to the skin and wash                       Gently with a scrungie or clean washcloth.  5.  Apply the CHG Soap to your body ONLY FROM THE NECK DOWN.   Do not use on face/ open                           Wound or open sores. Avoid contact with eyes, ears  mouth and genitals (private parts).                       Wash face,  Genitals (private parts) with your normal soap.             6.  Wash thoroughly, paying special attention to the area where your surgery  will be performed.  7.  Thoroughly rinse your body with warm water from the neck down.  8.  DO NOT shower/wash with your normal soap after using and rinsing off  the CHG Soap.                9.  Pat yourself dry with a clean towel.            10.  Wear clean pajamas.            11.  Place clean sheets on your bed the night of your first shower and do not  sleep with pets. Day of Surgery : Do not apply any lotions/deodorants the morning of surgery.  Please wear clean clothes to the hospital/surgery center.  FAILURE TO FOLLOW THESE INSTRUCTIONS MAY RESULT IN THE CANCELLATION OF YOUR SURGERY PATIENT SIGNATURE_________________________________  NURSE SIGNATURE__________________________________  ________________________________________________________________________

## 2019-03-07 ENCOUNTER — Other Ambulatory Visit (HOSPITAL_COMMUNITY)
Admission: RE | Admit: 2019-03-07 | Discharge: 2019-03-07 | Disposition: A | Discharge status: Home / Self Care | Payer: Medicare Other | Source: Ambulatory Visit | Attending: Orthopedic Surgery | Admitting: Orthopedic Surgery

## 2019-03-07 ENCOUNTER — Other Ambulatory Visit: Payer: Self-pay

## 2019-03-07 ENCOUNTER — Other Ambulatory Visit (HOSPITAL_COMMUNITY): Payer: 59

## 2019-03-07 DIAGNOSIS — Z01812 Encounter for preprocedural laboratory examination: Secondary | ICD-10-CM | POA: Insufficient documentation

## 2019-03-07 DIAGNOSIS — Z20828 Contact with and (suspected) exposure to other viral communicable diseases: Secondary | ICD-10-CM | POA: Insufficient documentation

## 2019-03-07 DIAGNOSIS — Z20822 Contact with and (suspected) exposure to covid-19: Secondary | ICD-10-CM

## 2019-03-07 LAB — SARS CORONAVIRUS 2 (TAT 6-24 HRS): SARS Coronavirus 2: NEGATIVE

## 2019-03-07 NOTE — Addendum Note (Signed)
Addended by: Danielle Dess on: 03/07/2019 12:03 PM   Modules accepted: Orders

## 2019-03-10 ENCOUNTER — Encounter (HOSPITAL_COMMUNITY)
Admission: RE | Admit: 2019-03-10 | Discharge: 2019-03-10 | Disposition: A | Discharge status: Home / Self Care | Payer: Medicare Other | Source: Ambulatory Visit | Attending: Orthopedic Surgery | Admitting: Orthopedic Surgery

## 2019-03-10 ENCOUNTER — Other Ambulatory Visit: Payer: Self-pay

## 2019-03-10 ENCOUNTER — Encounter (HOSPITAL_COMMUNITY): Payer: Self-pay

## 2019-03-10 DIAGNOSIS — M19011 Primary osteoarthritis, right shoulder: Secondary | ICD-10-CM | POA: Insufficient documentation

## 2019-03-10 DIAGNOSIS — Z01812 Encounter for preprocedural laboratory examination: Secondary | ICD-10-CM | POA: Insufficient documentation

## 2019-03-10 HISTORY — DX: Anxiety disorder, unspecified: F41.9

## 2019-03-10 LAB — CBC
HCT: 46.6 % — ABNORMAL HIGH (ref 36.0–46.0)
Hemoglobin: 14.9 g/dL (ref 12.0–15.0)
MCH: 27.9 pg (ref 26.0–34.0)
MCHC: 32 g/dL (ref 30.0–36.0)
MCV: 87.1 fL (ref 80.0–100.0)
Platelets: 313 10*3/uL (ref 150–400)
RBC: 5.35 MIL/uL — ABNORMAL HIGH (ref 3.87–5.11)
RDW: 14.5 % (ref 11.5–15.5)
WBC: 7.9 10*3/uL (ref 4.0–10.5)
nRBC: 0 % (ref 0.0–0.2)

## 2019-03-10 LAB — BASIC METABOLIC PANEL
Anion gap: 10 (ref 5–15)
BUN: 17 mg/dL (ref 8–23)
CO2: 29 mmol/L (ref 22–32)
Calcium: 9.4 mg/dL (ref 8.9–10.3)
Chloride: 99 mmol/L (ref 98–111)
Creatinine, Ser: 0.67 mg/dL (ref 0.44–1.00)
GFR calc Af Amer: >60 mL/min (ref >60)
GFR calc non Af Amer: >60 mL/min (ref >60)
Glucose, Bld: 89 mg/dL (ref 70–99)
Potassium: 3.3 mmol/L — ABNORMAL LOW (ref 3.5–5.1)
Sodium: 138 mmol/L (ref 135–145)

## 2019-03-11 ENCOUNTER — Inpatient Hospital Stay (HOSPITAL_COMMUNITY): Payer: Medicare Other | Admitting: Anesthesiology

## 2019-03-11 ENCOUNTER — Inpatient Hospital Stay (HOSPITAL_COMMUNITY)
Admission: RE | Admit: 2019-03-11 | Discharge: 2019-03-12 | DRG: 483 | Disposition: A | Discharge status: Home / Self Care | Payer: Medicare Other | Attending: Orthopedic Surgery | Admitting: Orthopedic Surgery

## 2019-03-11 ENCOUNTER — Encounter (HOSPITAL_COMMUNITY)
Admission: RE | Disposition: A | Discharge status: Home / Self Care | Payer: Self-pay | Source: Home / Self Care | Attending: Orthopedic Surgery

## 2019-03-11 ENCOUNTER — Encounter (HOSPITAL_COMMUNITY): Payer: Self-pay | Admitting: *Deleted

## 2019-03-11 ENCOUNTER — Telehealth (HOSPITAL_COMMUNITY): Payer: Self-pay | Admitting: *Deleted

## 2019-03-11 ENCOUNTER — Inpatient Hospital Stay (HOSPITAL_COMMUNITY): Payer: Medicare Other | Admitting: Emergency Medicine

## 2019-03-11 DIAGNOSIS — M19011 Primary osteoarthritis, right shoulder: Secondary | ICD-10-CM | POA: Diagnosis present

## 2019-03-11 DIAGNOSIS — Z96651 Presence of right artificial knee joint: Secondary | ICD-10-CM | POA: Diagnosis present

## 2019-03-11 DIAGNOSIS — F419 Anxiety disorder, unspecified: Secondary | ICD-10-CM | POA: Diagnosis present

## 2019-03-11 DIAGNOSIS — Z981 Arthrodesis status: Secondary | ICD-10-CM

## 2019-03-11 DIAGNOSIS — K219 Gastro-esophageal reflux disease without esophagitis: Secondary | ICD-10-CM | POA: Diagnosis present

## 2019-03-11 DIAGNOSIS — I10 Essential (primary) hypertension: Secondary | ICD-10-CM | POA: Diagnosis present

## 2019-03-11 DIAGNOSIS — Z96611 Presence of right artificial shoulder joint: Secondary | ICD-10-CM

## 2019-03-11 DIAGNOSIS — Z79899 Other long term (current) drug therapy: Secondary | ICD-10-CM | POA: Diagnosis not present

## 2019-03-11 HISTORY — PX: REVERSE SHOULDER ARTHROPLASTY: SHX5054

## 2019-03-11 LAB — SURGICAL PCR SCREEN
MRSA, PCR: POSITIVE — AB
Staphylococcus aureus: POSITIVE — AB

## 2019-03-11 SURGERY — ARTHROPLASTY, SHOULDER, TOTAL, REVERSE
Anesthesia: General | Site: Shoulder | Laterality: Right

## 2019-03-11 MED ORDER — BUPIVACAINE HCL (PF) 0.5 % IJ SOLN
INTRAMUSCULAR | Status: DC | PRN
  Administered 2019-03-11: 15 mL via PERINEURAL

## 2019-03-11 MED ORDER — SODIUM CHLORIDE 0.9 % IV SOLN
INTRAVENOUS | Status: DC | PRN
  Administered 2019-03-11: 25 ug/min via INTRAVENOUS

## 2019-03-11 MED ORDER — HYDROMORPHONE HCL 1 MG/ML IJ SOLN: 0.5000 mg | INTRAMUSCULAR | Status: DC | PRN

## 2019-03-11 MED ORDER — FENTANYL CITRATE (PF) 100 MCG/2ML IJ SOLN
INTRAMUSCULAR | Status: AC
  Filled 2019-03-11: qty 2

## 2019-03-11 MED ORDER — MAGNESIUM CITRATE PO SOLN: 1.0000 | Freq: Once | ORAL | Status: DC | PRN

## 2019-03-11 MED ORDER — LACTATED RINGERS IV SOLN
INTRAVENOUS | Status: DC
  Administered 2019-03-11: 11:00:00 via INTRAVENOUS

## 2019-03-11 MED ORDER — METOCLOPRAMIDE HCL 5 MG PO TABS: 5.0000 mg | ORAL_TABLET | Freq: Three times a day (TID) | ORAL | Status: DC | PRN

## 2019-03-11 MED ORDER — SODIUM CHLORIDE 0.9 % IR SOLN
Status: DC | PRN
  Administered 2019-03-11: 1000 mL

## 2019-03-11 MED ORDER — DEXAMETHASONE SODIUM PHOSPHATE 10 MG/ML IJ SOLN
INTRAMUSCULAR | Status: DC | PRN
  Administered 2019-03-11: 8 mg via INTRAVENOUS

## 2019-03-11 MED ORDER — ACETAMINOPHEN 325 MG PO TABS: 325.0000 mg | ORAL_TABLET | Freq: Four times a day (QID) | ORAL | Status: DC | PRN

## 2019-03-11 MED ORDER — SUGAMMADEX SODIUM 200 MG/2ML IV SOLN
INTRAVENOUS | Status: DC | PRN
  Administered 2019-03-11: 200 mg via INTRAVENOUS

## 2019-03-11 MED ORDER — ONDANSETRON HCL 4 MG/2ML IJ SOLN
INTRAMUSCULAR | Status: AC
  Filled 2019-03-11: qty 2

## 2019-03-11 MED ORDER — TRANEXAMIC ACID-NACL 1000-0.7 MG/100ML-% IV SOLN
1000.0000 mg | INTRAVENOUS | Status: AC
  Administered 2019-03-11: 1000 mg via INTRAVENOUS
  Filled 2019-03-11: qty 100

## 2019-03-11 MED ORDER — CEFAZOLIN SODIUM-DEXTROSE 2-4 GM/100ML-% IV SOLN
2.0000 g | INTRAVENOUS | Status: AC
  Administered 2019-03-11: 13:00:00 2 g via INTRAVENOUS
  Filled 2019-03-11: qty 100

## 2019-03-11 MED ORDER — PHENYLEPHRINE 40 MCG/ML (10ML) SYRINGE FOR IV PUSH (FOR BLOOD PRESSURE SUPPORT)
PREFILLED_SYRINGE | INTRAVENOUS | Status: DC | PRN
  Administered 2019-03-11 (×2): 120 ug via INTRAVENOUS

## 2019-03-11 MED ORDER — METOCLOPRAMIDE HCL 5 MG/ML IJ SOLN: 5.0000 mg | Freq: Three times a day (TID) | INTRAMUSCULAR | Status: DC | PRN

## 2019-03-11 MED ORDER — MIDAZOLAM HCL 2 MG/2ML IJ SOLN
1.0000 mg | INTRAMUSCULAR | Status: DC
  Administered 2019-03-11: 12:00:00 1 mg via INTRAVENOUS
  Filled 2019-03-11: qty 2

## 2019-03-11 MED ORDER — BUPIVACAINE LIPOSOME 1.3 % IJ SUSP
INTRAMUSCULAR | Status: DC | PRN
  Administered 2019-03-11: 10 mL via PERINEURAL

## 2019-03-11 MED ORDER — ALUM & MAG HYDROXIDE-SIMETH 200-200-20 MG/5ML PO SUSP: 30.0000 mL | ORAL | Status: DC | PRN

## 2019-03-11 MED ORDER — ONDANSETRON HCL 4 MG PO TABS: 4.0000 mg | ORAL_TABLET | Freq: Four times a day (QID) | ORAL | Status: DC | PRN

## 2019-03-11 MED ORDER — FENTANYL CITRATE (PF) 100 MCG/2ML IJ SOLN
INTRAMUSCULAR | Status: DC | PRN
  Administered 2019-03-11: 100 ug via INTRAVENOUS

## 2019-03-11 MED ORDER — CHLORHEXIDINE GLUCONATE 4 % EX LIQD: 60.0000 mL | Freq: Once | CUTANEOUS | Status: DC

## 2019-03-11 MED ORDER — OXYCODONE HCL 5 MG PO TABS
5.0000 mg | ORAL_TABLET | ORAL | Status: DC | PRN
  Administered 2019-03-12: 5 mg via ORAL
  Filled 2019-03-11: qty 1

## 2019-03-11 MED ORDER — METHOCARBAMOL 500 MG IVPB - SIMPLE MED
500.0000 mg | Freq: Four times a day (QID) | INTRAVENOUS | Status: DC | PRN
  Filled 2019-03-11: qty 50

## 2019-03-11 MED ORDER — STERILE WATER FOR IRRIGATION IR SOLN
Status: DC | PRN
  Administered 2019-03-11: 2000 mL

## 2019-03-11 MED ORDER — DEXAMETHASONE SODIUM PHOSPHATE 10 MG/ML IJ SOLN
INTRAMUSCULAR | Status: AC
  Filled 2019-03-11: qty 1

## 2019-03-11 MED ORDER — ONDANSETRON HCL 4 MG/2ML IJ SOLN
INTRAMUSCULAR | Status: DC | PRN
  Administered 2019-03-11: 4 mg via INTRAVENOUS

## 2019-03-11 MED ORDER — HYDROCHLOROTHIAZIDE 25 MG PO TABS
25.0000 mg | ORAL_TABLET | Freq: Every day | ORAL | Status: DC
  Administered 2019-03-12: 25 mg via ORAL
  Filled 2019-03-11: qty 1

## 2019-03-11 MED ORDER — MENTHOL 3 MG MT LOZG: 1.0000 | LOZENGE | OROMUCOSAL | Status: DC | PRN

## 2019-03-11 MED ORDER — LACTATED RINGERS IV SOLN
INTRAVENOUS | Status: DC
  Administered 2019-03-11: 18:00:00 via INTRAVENOUS

## 2019-03-11 MED ORDER — ROCURONIUM BROMIDE 10 MG/ML (PF) SYRINGE
PREFILLED_SYRINGE | INTRAVENOUS | Status: DC | PRN
  Administered 2019-03-11: 60 mg via INTRAVENOUS

## 2019-03-11 MED ORDER — FENTANYL CITRATE (PF) 100 MCG/2ML IJ SOLN: 25.0000 ug | INTRAMUSCULAR | Status: DC | PRN

## 2019-03-11 MED ORDER — EPHEDRINE SULFATE-NACL 50-0.9 MG/10ML-% IV SOSY
PREFILLED_SYRINGE | INTRAVENOUS | Status: DC | PRN
  Administered 2019-03-11: 5 mg via INTRAVENOUS

## 2019-03-11 MED ORDER — AMLODIPINE BESYLATE 5 MG PO TABS
5.0000 mg | ORAL_TABLET | Freq: Every day | ORAL | Status: DC
  Administered 2019-03-12: 08:00:00 5 mg via ORAL
  Filled 2019-03-11: qty 1

## 2019-03-11 MED ORDER — PHENYLEPHRINE HCL (PRESSORS) 10 MG/ML IV SOLN
INTRAVENOUS | Status: AC
  Filled 2019-03-11: qty 1

## 2019-03-11 MED ORDER — CLONAZEPAM 1 MG PO TABS
1.0000 mg | ORAL_TABLET | Freq: Two times a day (BID) | ORAL | Status: DC | PRN
  Administered 2019-03-12: 1 mg via ORAL
  Filled 2019-03-11: qty 1

## 2019-03-11 MED ORDER — OXYCODONE HCL 5 MG PO TABS: 10.0000 mg | ORAL_TABLET | ORAL | Status: DC | PRN

## 2019-03-11 MED ORDER — ONDANSETRON HCL 4 MG/2ML IJ SOLN: 4.0000 mg | Freq: Four times a day (QID) | INTRAMUSCULAR | Status: DC | PRN

## 2019-03-11 MED ORDER — LIDOCAINE 2% (20 MG/ML) 5 ML SYRINGE
INTRAMUSCULAR | Status: DC | PRN
  Administered 2019-03-11: 40 mg via INTRAVENOUS

## 2019-03-11 MED ORDER — PROPOFOL 10 MG/ML IV BOLUS
INTRAVENOUS | Status: AC
  Filled 2019-03-11: qty 20

## 2019-03-11 MED ORDER — VANCOMYCIN HCL IN DEXTROSE 1-5 GM/200ML-% IV SOLN
INTRAVENOUS | Status: AC
  Administered 2019-03-11: 12:00:00 1000 mg via INTRAVENOUS
  Filled 2019-03-11: qty 200

## 2019-03-11 MED ORDER — VANCOMYCIN HCL IN DEXTROSE 1-5 GM/200ML-% IV SOLN
1000.0000 mg | Freq: Once | INTRAVENOUS | Status: AC
  Administered 2019-03-11: 12:00:00 1000 mg via INTRAVENOUS

## 2019-03-11 MED ORDER — ROCURONIUM BROMIDE 10 MG/ML (PF) SYRINGE
PREFILLED_SYRINGE | INTRAVENOUS | Status: AC
  Filled 2019-03-11: qty 10

## 2019-03-11 MED ORDER — METHOCARBAMOL 500 MG PO TABS
500.0000 mg | ORAL_TABLET | Freq: Four times a day (QID) | ORAL | Status: DC | PRN
  Administered 2019-03-12: 04:00:00 500 mg via ORAL
  Filled 2019-03-11: qty 1

## 2019-03-11 MED ORDER — ONDANSETRON HCL 4 MG/2ML IJ SOLN: 4.0000 mg | Freq: Once | INTRAMUSCULAR | Status: DC | PRN

## 2019-03-11 MED ORDER — LIDOCAINE 2% (20 MG/ML) 5 ML SYRINGE
INTRAMUSCULAR | Status: AC
  Filled 2019-03-11: qty 5

## 2019-03-11 MED ORDER — KETOROLAC TROMETHAMINE 15 MG/ML IJ SOLN
15.0000 mg | Freq: Four times a day (QID) | INTRAMUSCULAR | Status: DC
  Administered 2019-03-11 – 2019-03-12 (×2): 15 mg via INTRAVENOUS
  Filled 2019-03-11 (×2): qty 1

## 2019-03-11 MED ORDER — FENTANYL CITRATE (PF) 100 MCG/2ML IJ SOLN
50.0000 ug | INTRAMUSCULAR | Status: DC
  Administered 2019-03-11: 12:00:00 50 ug via INTRAVENOUS
  Filled 2019-03-11: qty 2

## 2019-03-11 MED ORDER — DOCUSATE SODIUM 100 MG PO CAPS
100.0000 mg | ORAL_CAPSULE | Freq: Two times a day (BID) | ORAL | Status: DC
  Filled 2019-03-11: qty 1

## 2019-03-11 MED ORDER — PROPOFOL 10 MG/ML IV BOLUS
INTRAVENOUS | Status: DC | PRN
  Administered 2019-03-11: 160 mg via INTRAVENOUS

## 2019-03-11 MED ORDER — OXYCODONE HCL 5 MG PO TABS: 5.0000 mg | ORAL_TABLET | Freq: Once | ORAL | Status: DC | PRN

## 2019-03-11 MED ORDER — OXYCODONE HCL 5 MG/5ML PO SOLN: 5.0000 mg | Freq: Once | ORAL | Status: DC | PRN

## 2019-03-11 MED ORDER — POLYETHYLENE GLYCOL 3350 17 G PO PACK: 17.0000 g | PACK | Freq: Every day | ORAL | Status: DC | PRN

## 2019-03-11 MED ORDER — PHENOL 1.4 % MT LIQD: 1.0000 | OROMUCOSAL | Status: DC | PRN

## 2019-03-11 MED ORDER — PANTOPRAZOLE SODIUM 40 MG PO TBEC
40.0000 mg | DELAYED_RELEASE_TABLET | Freq: Every day | ORAL | Status: DC
  Administered 2019-03-11 – 2019-03-12 (×2): 40 mg via ORAL
  Filled 2019-03-11 (×2): qty 1

## 2019-03-11 MED ORDER — DIPHENHYDRAMINE HCL 12.5 MG/5ML PO ELIX: 12.5000 mg | ORAL_SOLUTION | ORAL | Status: DC | PRN

## 2019-03-11 MED ORDER — BISACODYL 5 MG PO TBEC: 5.0000 mg | DELAYED_RELEASE_TABLET | Freq: Every day | ORAL | Status: DC | PRN

## 2019-03-11 SURGICAL SUPPLY — 71 items
BAG ZIPLOCK 12X15 (MISCELLANEOUS) ×3 IMPLANT
BLADE SAW SGTL 83.5X18.5 (BLADE) ×3 IMPLANT
COOLER ICEMAN CLASSIC (MISCELLANEOUS) ×2 IMPLANT
COVER BACK TABLE 60X90IN (DRAPES) ×3 IMPLANT
COVER SURGICAL LIGHT HANDLE (MISCELLANEOUS) ×3 IMPLANT
COVER WAND RF STERILE (DRAPES) IMPLANT
CUP SUT UNIV REVERS 36 NEUTRAL (Cup) ×2 IMPLANT
DERMABOND ADVANCED (GAUZE/BANDAGES/DRESSINGS) ×2
DERMABOND ADVANCED .7 DNX12 (GAUZE/BANDAGES/DRESSINGS) ×1 IMPLANT
DRAPE INCISE IOBAN 66X45 STRL (DRAPES) IMPLANT
DRAPE ORTHO SPLIT 77X108 STRL (DRAPES) ×4
DRAPE SHEET LG 3/4 BI-LAMINATE (DRAPES) ×3 IMPLANT
DRAPE SURG 17X11 SM STRL (DRAPES) ×3 IMPLANT
DRAPE SURG ORHT 6 SPLT 77X108 (DRAPES) ×2 IMPLANT
DRAPE U-SHAPE 47X51 STRL (DRAPES) ×3 IMPLANT
DRSG AQUACEL AG ADV 3.5X10 (GAUZE/BANDAGES/DRESSINGS) ×3 IMPLANT
DURAPREP 26ML APPLICATOR (WOUND CARE) ×3 IMPLANT
ELECT BLADE TIP CTD 4 INCH (ELECTRODE) ×3 IMPLANT
ELECT REM PT RETURN 15FT ADLT (MISCELLANEOUS) ×3 IMPLANT
FACESHIELD WRAPAROUND (MASK) ×9 IMPLANT
FACESHIELD WRAPAROUND OR TEAM (MASK) ×4 IMPLANT
GLENOID UNI REV MOD 24 +2 LAT (Joint) ×2 IMPLANT
GLENOSPHERE 36 +4 LAT/24 (Joint) ×2 IMPLANT
GLOVE BIO SURGEON STRL SZ7.5 (GLOVE) ×3 IMPLANT
GLOVE BIO SURGEON STRL SZ8 (GLOVE) ×3 IMPLANT
GLOVE SS BIOGEL STRL SZ 7 (GLOVE) ×1 IMPLANT
GLOVE SS BIOGEL STRL SZ 7.5 (GLOVE) ×1 IMPLANT
GLOVE SUPERSENSE BIOGEL SZ 7 (GLOVE) ×2
GLOVE SUPERSENSE BIOGEL SZ 7.5 (GLOVE) ×2
GLOVE SURG SYN 7.0 (GLOVE) IMPLANT
GLOVE SURG SYN 7.0 PF PI (GLOVE) IMPLANT
GLOVE SURG SYN 7.5  E (GLOVE)
GLOVE SURG SYN 7.5 E (GLOVE) IMPLANT
GLOVE SURG SYN 7.5 PF PI (GLOVE) IMPLANT
GLOVE SURG SYN 8.0 (GLOVE) IMPLANT
GLOVE SURG SYN 8.0 PF PI (GLOVE) IMPLANT
GOWN STRL REUS W/TWL LRG LVL3 (GOWN DISPOSABLE) ×6 IMPLANT
INSERT HUMERAL 36 +6 (Shoulder) ×2 IMPLANT
KIT BASIN OR (CUSTOM PROCEDURE TRAY) ×3 IMPLANT
KIT TURNOVER KIT A (KITS) IMPLANT
MANIFOLD NEPTUNE II (INSTRUMENTS) ×3 IMPLANT
NDL TAPERED W/ NITINOL LOOP (MISCELLANEOUS) ×1 IMPLANT
NEEDLE TAPERED W/ NITINOL LOOP (MISCELLANEOUS) ×3 IMPLANT
NS IRRIG 1000ML POUR BTL (IV SOLUTION) ×3 IMPLANT
PACK SHOULDER (CUSTOM PROCEDURE TRAY) ×3 IMPLANT
PAD ARMBOARD 7.5X6 YLW CONV (MISCELLANEOUS) ×3 IMPLANT
PAD COLD SHLDR WRAP-ON (PAD) ×2 IMPLANT
PIN SET MODULAR GLENOID SYSTEM (PIN) ×2 IMPLANT
RESTRAINT HEAD UNIVERSAL NS (MISCELLANEOUS) ×3 IMPLANT
SCREW CENTRAL MOD 30MM (Screw) ×2 IMPLANT
SCREW PERI LOCK 5.5X16 (Screw) ×2 IMPLANT
SCREW PERI LOCK 5.5X24 (Screw) ×2 IMPLANT
SCREW PERI LOCK 5.5X32 (Screw) ×2 IMPLANT
SCREW PERIPHERAL 5.5X20 LOCK (Screw) ×2 IMPLANT
SLING ARM FOAM STRAP LRG (SOFTGOODS) ×2 IMPLANT
SLING ARM FOAM STRAP MED (SOFTGOODS) IMPLANT
SPONGE LAP 18X18 RF (DISPOSABLE) IMPLANT
STEM HUMERAL UNI REVERS SZ6 (Stem) ×2 IMPLANT
SUCTION FRAZIER HANDLE 12FR (TUBING) ×2
SUCTION TUBE FRAZIER 12FR DISP (TUBING) ×1 IMPLANT
SUT FIBERWIRE #2 38 T-5 BLUE (SUTURE) ×3
SUT MNCRL AB 3-0 PS2 18 (SUTURE) ×3 IMPLANT
SUT MON AB 2-0 CT1 36 (SUTURE) ×3 IMPLANT
SUT VIC AB 1 CT1 36 (SUTURE) ×3 IMPLANT
SUTURE FIBERWR #2 38 T-5 BLUE (SUTURE) IMPLANT
SUTURE TAPE 1.3 40 TPR END (SUTURE) ×2 IMPLANT
SUTURETAPE 1.3 40 TPR END (SUTURE) ×6
TOWEL OR 17X26 10 PK STRL BLUE (TOWEL DISPOSABLE) ×3 IMPLANT
TOWEL OR NON WOVEN STRL DISP B (DISPOSABLE) ×3 IMPLANT
WATER STERILE IRR 1000ML POUR (IV SOLUTION) ×6 IMPLANT
YANKAUER SUCT BULB TIP 10FT TU (MISCELLANEOUS) ×3 IMPLANT

## 2019-03-11 NOTE — Transfer of Care (Signed)
Immediate Anesthesia Transfer of Care Note  Patient: Amber Snyder  Procedure(s) Performed: REVERSE SHOULDER ARTHROPLASTY (Right Shoulder)  Patient Location: PACU  Anesthesia Type:General  Level of Consciousness: awake  Airway & Oxygen Therapy: Patient Spontanous Breathing and Patient connected to face mask oxygen  Post-op Assessment: Report given to RN and Post -op Vital signs reviewed and stable  Post vital signs: Reviewed and stable  Last Vitals:  Vitals Value Taken Time  BP    Temp    Pulse 87 03/11/19 1500  Resp 20 03/11/19 1500  SpO2 90 % 03/11/19 1500  Vitals shown include unvalidated device data.  Last Pain:  Vitals:   03/11/19 1101  TempSrc: Oral      Patients Stated Pain Goal: 3 (71/16/57 9038)  Complications: No apparent anesthesia complications

## 2019-03-11 NOTE — Progress Notes (Signed)
Assisted Dr. Brock with right, ultrasound guided, interscalene  block. Side rails up, monitors on throughout procedure. See vital signs in flow sheet. Tolerated Procedure well.  

## 2019-03-11 NOTE — Progress Notes (Signed)
Pt seen in pre-op at 2:00 pm. Final PCR needed. Received call from lab today 03-12-19 @11 :27 AM, that patient is positive for MRSA. Spoke to [place], Therapist, sports in PACU to make staff aware. Sharyn Lull verbalized understanding of positive PCR results.   FYI..Unable to add to + MRSA to 'special needs column', as chart was in use by another user.

## 2019-03-11 NOTE — Op Note (Signed)
03/11/2019  2:32 PM  PATIENT:   Amber Snyder  68 y.o. 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  ASSISTANTS: Jenetta Loges, PA-C  ANESTHESIA:   General endotracheal and interscalene block with Exparel  EBL: 150 cc  SPECIMEN: None  Drains: None   PATIENT DISPOSITION:  PACU - hemodynamically stable.    PLAN OF CARE: Admit for overnight observation  Brief history:  Amber Snyder is a 68 year old female who has had chronic and progressively increasing right shoulder pain related to end stage osteoarthritis with associated rotator cuff dysfunction.  Due to her increasing pain and functional limitation she is brought to the operating this time for planned right shoulder reverse arthroplasty  Preoperatively I counseled Amber Snyder regarding treatment options as well as the potential risks versus benefits thereof.  Possible surgical complications were reviewed including bleeding, infection, neurovascular injury, persistent pain, loss of motion, anesthetic complication, failure of implant, and possible need for additional surgery.  She understands, and accepts, and agrees with our planned procedure.  Procedure in detail:  After undergoing routine preop evaluation patient received prophylactic antibiotics and interscalene block with Exparel was established in the holding area by the anesthesia department.  Patient then placed supine on the operating table and underwent the smooth induction of a general endotracheal anesthesia.  Placed into the beachchair position and appropriately padded and protected.  Right shoulder girdle region was sterilely prepped and  draped in standard fashion.  Timeout was called.  An anterior deltopectoral approach to the right shoulder is made through a 10 cm incision.  Skin flaps were elevated dissection carried deeply and electrocautery was used for hemostasis.  The deltopectoral interval was then developed from proximal to distal with the vein taken laterally in the upper centimeter the pectoralis major tendon was tenotomized to enhance exposure in the conjoined tendon was then mobilized and retracted medially.  The long head biceps tendon was then tenodesed at the upper border of the pectoralis major tendon and it was then unroofed and excised proximally.  The subscapularis was then divided from its insertion along the lesser tuberosity and tagged with suture tape sutures in the rotator interval was then split to the base of the coracoid and the subscap was reflected medially.  Capsular attachments were then divided in a subperiosteal fashion from the anterior inferior margins of the humeral neck and humeral head was then delivered through the wound.  Severe joint space destruction was noted.  The extra medullary guide was used to outline our proposed humeral head resection which was performed with the oscillating saw maintaining the native retroversion of approximate 20degrees.  Peripheral osteophytes were then removed with a rondure and a metal cap was then placed over the cut proximal humeral surface.  The glenoid was then exposed with a combination of Fukuda and pitchfork retractors.  A circumferential labral resection was then performed gaining complete visualization of the periphery of the glenoid.  A guidepin was then directed into the center of the glenoid with an approximate 10 degree inferior tilt and the glenoid was then prepared with the central followed by the peripheral reamer to a stable subchondral bony bed.  The central drill hole was then prepared and tapped in the  baseplate with a 30 mm lag screw was then inserted with  excellent fit and fixation.  The peripheral locking screws were all then placed with excellent fit and fixation.  The 36/+4 glenosphere was then impacted onto the baseplate and the central locking screw was placed with excellent fixation.  We then returned our attention to the proximal humerus where the canal finder was used to open the canal and we broached up to size 6 which showed solid fit maintain the native retroversion and then we used the neutral metaphyseal reamer to prepare the metaphysis.  A trial was then placed which showed good soft tissue balance.  At this point our final implant was assembled on the back table and this was then impacted into place with excellent fit and fixation.  We then performed a series of trial reductions in the +6 polyethylene insert gave [place] the best soft tissue balance.  The trial was removed the final polywas impacted and final reduction was performed again showing excellent soft tissue balance good motion good stability.  Using copious irrigated.  Hemostasis was obtained.  The subscapularis was then repaired back to the metaphysis using the eyelets on the collar of our implant.  Wound was reirrigated and the deltopectoral interval was then reapproximated with a series of figure-of-eight and 1 Vicryl sutures.  2-0 Vicryl used to close the subcu layer and intracuticular 3 Monocryl for the skin followed by Dermabond and Aquasol dressing.  Right and was then placed in a sling the patient was awakened, extubated, and taken recovery in stable addition.  Ralene Bathe, PA-C was used as an Geophysicist/field seismologist throughout this case essential for help with positioning of the patient, positioning extremity, tissue manipulation, implantation of the prosthesis, wound closure, and intraoperative decision-making.  Vania Rea Spencer Peterkin MD   Contact # [phone]

## 2019-03-11 NOTE — Anesthesia Procedure Notes (Signed)
Procedure Name: Intubation Date/Time: 03/11/2019 1:24 PM Performed by: Niel Hummer, CRNA Pre-anesthesia Checklist: Patient identified, Emergency Drugs available, Suction available and Patient being monitored Patient Re-evaluated:Patient Re-evaluated prior to induction Oxygen Delivery Method: Circle system utilized Preoxygenation: Pre-oxygenation with 100% oxygen Induction Type: IV induction Ventilation: Mask ventilation without difficulty and Oral airway inserted - appropriate to patient size Laryngoscope Size: Mac and 4 Grade View: Grade I Tube type: Oral Tube size: 7.0 mm Number of attempts: 1 Airway Equipment and Method: Stylet Placement Confirmation: ETT inserted through vocal cords under direct vision,  positive ETCO2 and breath sounds checked- equal and bilateral Secured at: 22 cm Tube secured with: Tape Dental Injury: Teeth and Oropharynx as per pre-operative assessment
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
## 2019-03-11 NOTE — Anesthesia Procedure Notes (Signed)
Anesthesia Regional Block: Interscalene brachial plexus block   Pre-Anesthetic Checklist: ,, timeout performed, Correct Patient, Correct Site, Correct Laterality, Correct Procedure, Correct Position, site marked, Risks and benefits discussed,  Surgical consent,  Pre-op evaluation,  At surgeon's request and post-op pain management  Laterality: Right  Prep: chloraprep       Needles:  Injection technique: Single-shot  Needle Type: Echogenic Needle     Needle Length: 5cm  Needle Gauge: 21     Additional Needles:   Narrative:  Start time: 03/11/2019 11:54 AM End time: 03/11/2019 11:58 AM Injection made incrementally with aspirations every 5 mL.  Performed by: Personally  Anesthesiologist: Audry Pili, MD  Additional Notes: No pain on injection. No increased resistance to injection. Injection made in 5cc increments. Good needle visualization. Patient tolerated the procedure well.

## 2019-03-11 NOTE — Anesthesia Postprocedure Evaluation (Signed)
Anesthesia Post Note  Patient: Amber Snyder  Procedure(s) Performed: REVERSE SHOULDER ARTHROPLASTY (Right Shoulder)     Patient location during evaluation: PACU Anesthesia Type: General Level of consciousness: awake and alert Pain management: pain level controlled Vital Signs Assessment: post-procedure vital signs reviewed and stable Respiratory status: spontaneous breathing, nonlabored ventilation, respiratory function stable and patient connected to nasal cannula oxygen Cardiovascular status: blood pressure returned to baseline and stable Postop Assessment: no apparent nausea or vomiting Anesthetic complications: no    Last Vitals:  Vitals:   03/11/19 1530 03/11/19 1545  BP: (!) 144/76 (!) 156/78  Pulse: 85 81  Resp: (!) 23 (!) 23  Temp:    SpO2: 92% 95%    Last Pain:  Vitals:   03/11/19 1545  TempSrc:   PainSc: 0-No pain                 Audry Pili

## 2019-03-11 NOTE — Discharge Instructions (Signed)
° °Kevin M. Supple, M.D., F.A.A.O.S. °Orthopaedic Surgery °Specializing in Arthroscopic and Reconstructive °Surgery of the Shoulder °[phone] °[address] - [place], [place] [address] - Fax [phone] ° ° °POST-OP TOTAL SHOULDER REPLACEMENT INSTRUCTIONS ° °1. Call the office at [phone] to schedule your first post-op appointment 10-14 days from the date of your surgery. ° °2. The bandage over your incision is waterproof. You may begin showering with this dressing on. You may leave this dressing on until first follow up appointment within 2 weeks. We prefer you leave this dressing in place until follow up however after 5-7 days if you are having itching or skin irritation and would like to remove it you may do so. Go slow and tug at the borders gently to break the bond the dressing has with the skin. At this point if there is no drainage it is okay to go without a bandage or you may cover it with a light guaze and tape. You can also expect significant bruising around your shoulder that will drift down your arm and into your chest wall. This is very normal and should resolve over several days. ° ° 3. Wear your sling/immobilizer at all times except to perform the exercises below or to occasionally let your arm dangle by your side to stretch your elbow. You also need to sleep in your sling immobilizer until instructed otherwise. It is ok to remove your sling if you are sitting in a controlled environment and allow your arm to rest in a position of comfort by your side or on your lap with pillows to give your neck and skin a break from the sling. You may remove it to allow arm to dangle by side to shower. If you are up walking around and when you go to sleep at night you need to wear it. ° °4. Range of motion to your elbow, wrist, and hand are encouraged 3-5 times daily. Exercise to your hand and fingers helps to reduce swelling you may experience. ° °5. Utilize ice to the shoulder 3-5 times  minimum a day and additionally if you are experiencing pain. ° °6. Prescriptions for a pain medication and a muscle relaxant are provided for you. It is recommended that if you are experiencing pain that you pain medication alone is not controlling, add the muscle relaxant along with the pain medication which can give additional pain relief. The first 1-2 days is generally the most severe of your pain and then should gradually decrease. As your pain lessens it is recommended that you decrease your use of the pain medications to an "as needed basis'" only and to always comply with the recommended dosages of the pain medications. ° °7. Pain medications can produce constipation along with their use. If you experience this, the use of an over the counter stool softener or laxative daily is recommended.  ° °8. For additional questions or concerns, please do not hesitate to call the office. If after hours there is an answering service to forward your concerns to the physician on call. ° °9.Pain control following an exparel block ° °To help control your post-operative pain you received a nerve block  performed with Exparel which is a long acting anesthetic (numbing agent) which can provide pain relief and sensations of numbness (and relief of pain) in the operative shoulder and arm for up to 3 days. Sometimes it provides mixed relief, meaning you may still have numbness in certain areas of the arm but can still   be able to move  parts of that arm, hand, and fingers. We recommend that your prescribed pain medications  be used as needed. We do not feel it is necessary to "pre medicate" and "stay ahead" of pain.  Taking narcotic pain medications when you are not having any pain can lead to unnecessary and potentially dangerous side effects.   ° °10. Use the ice machine as much as possible in the first 5-7 days from surgery, then you can wean its use to as needed. The ice typically needs to be replaced every 6 hours, instead of  ice you can actually freeze water bottles to put in the cooler and then fill water around them to avoid having to purchase ice. You can have spare water bottles freezing to allow you to rotate them once they have melted. Try to have a thin shirt or light cloth or towel under the ice wrap to protect your skin.  ° °11.  We recommend that you avoid any dental work or cleaning in the first 3 months following your joint replacement. This is to help minimize the possibility of infection from the bacteria in your mouth that enters your bloodstream during dental work. We also recommend that you take an antibiotic prior to your dental work for the first year after your shoulder replacement to further help reduce that risk. Please simply contact our office for antibiotics to be sent to your pharmacy prior to dental work. ° °POST-OP EXERCISES ° °Pendulum Exercises ° °Perform pendulum exercises while standing and bending at the waist. Support your uninvolved arm on a table or chair and allow your operated arm to hang freely. Make sure to do these exercises passively - not using you shoulder muscles. These exercises can be performed once your nerve block effects have worn off. ° °Repeat 20 times. Do 3 sessions per day. ° ° ° ° °

## 2019-03-11 NOTE — Anesthesia Preprocedure Evaluation (Addendum)
Anesthesia Evaluation  Patient identified by MRN, date of birth, ID band Patient awake    Reviewed: Allergy & Precautions, NPO status , Patient's Chart, lab work & pertinent test results  History of Anesthesia Complications Negative for: history of anesthetic complications  Airway Mallampati: III  TM Distance: >3 FB Neck ROM: Full    Dental  (+) Dental Advisory Given, Caps   Pulmonary neg pulmonary ROS,    Pulmonary exam normal        Cardiovascular hypertension, Pt. on medications (-) anginaNormal cardiovascular exam     Neuro/Psych PSYCHIATRIC DISORDERS Anxiety  S/p ACDF     GI/Hepatic Neg liver ROS, hiatal hernia, GERD  Medicated and Controlled,  Endo/Other   Obesity   Renal/GU negative Renal ROS     Musculoskeletal  (+) Arthritis ,   Abdominal   Peds  Hematology negative hematology ROS (+)   Anesthesia Other Findings   Reproductive/Obstetrics                            Anesthesia Physical Anesthesia Plan  ASA: II  Anesthesia Plan: General   Post-op Pain Management:  Regional for Post-op pain   Induction: Intravenous  PONV Risk Score and Plan: 3 and Treatment may vary due to age or medical condition, Ondansetron and Dexamethasone  Airway Management Planned: Oral ETT  Additional Equipment: None  Intra-op Plan:   Post-operative Plan: Extubation in OR  Informed Consent: I have reviewed the patients History and Physical, chart, labs and discussed the procedure including the risks, benefits and alternatives for the proposed anesthesia with the patient or authorized representative who has indicated his/her understanding and acceptance.     Dental advisory given  Plan Discussed with: CRNA and Anesthesiologist  Anesthesia Plan Comments:        Anesthesia Quick Evaluation

## 2019-03-12 MED ORDER — ONDANSETRON HCL 4 MG PO TABS: 4.0000 mg | ORAL_TABLET | Freq: Three times a day (TID) | ORAL | 0 refills | Status: DC | PRN

## 2019-03-12 MED ORDER — OXYCODONE-ACETAMINOPHEN 5-325 MG PO TABS: 1.0000 | ORAL_TABLET | ORAL | 0 refills | Status: DC | PRN

## 2019-03-12 MED ORDER — NAPROXEN 500 MG PO TABS: 500.0000 mg | ORAL_TABLET | Freq: Two times a day (BID) | ORAL | 1 refills | Status: DC

## 2019-03-12 MED ORDER — CYCLOBENZAPRINE HCL 10 MG PO TABS: 10.0000 mg | ORAL_TABLET | Freq: Three times a day (TID) | ORAL | 1 refills | Status: DC | PRN

## 2019-03-12 NOTE — Evaluation (Signed)
Occupational Therapy Evaluation Patient Details Name: AZRIEL DANCY MRN: [id number] DOB: [birth year] Today's Date: 03/12/2019    History of Present Illness S/P reverse total shoulder arthroplasty, right   Clinical Impression   OT eval and education complete.  Handouts provided.  Pts daughter will A as needed  Per MD: OT eval and treat If sitting in controlled environment, ok to come out of sling to give neck a break. Please sleep in it to protect until follow up in office. OK to use operative arm for feeding, hygiene and ADLs. Ok to Pension scheme manager  Ok for PROM, AAROM, AROM within pain tolerance and within the following ROM  ER 20  ABD 45  FE 60     Follow Up Recommendations  Follow surgeon's recommendation for DC plan and follow-up therapies    Equipment Recommendations  None recommended by OT    Recommendations for Other Services       Precautions / Restrictions Precautions Precautions: Shoulder Shoulder Interventions: Shoulder sling/immobilizer;Off for dressing/bathing/exercises Restrictions Weight Bearing Restrictions: Yes RUE Weight Bearing: Non weight bearing Other Position/Activity Restrictions: RUE      Mobility Bed Mobility Overal bed mobility: Modified Independent                Transfers Overall transfer level: Modified independent                               Vision Patient Visual Report: No change from baseline              Pertinent Vitals/Pain Pain Assessment: No/denies pain           Communication Communication Communication: No difficulties   Cognition Arousal/Alertness: Awake/alert Behavior During Therapy: WFL for tasks assessed/performed Overall Cognitive Status: Within Functional Limits for tasks assessed                                        Exercises  gentle pendulum, lap slides, elbow/wrist and hand ROM   Shoulder Instructions Shoulder  Instructions Donning/doffing shirt without moving shoulder: Minimal assistance;Patient able to independently direct caregiver Method for sponge bathing under operated UE: Minimal assistance;Patient able to independently direct caregiver Donning/doffing sling/immobilizer: Minimal assistance;Patient able to independently direct caregiver Correct positioning of sling/immobilizer: Minimal assistance;Patient able to independently direct caregiver Pendulum exercises (written home exercise program): Minimal assistance;Patient able to independently direct caregiver ROM for elbow, wrist and digits of operated UE: Minimal assistance;Patient able to independently direct caregiver Sling wearing schedule (on at all times/off for ADL's): Minimal assistance;Patient able to independently direct caregiver Proper positioning of operated UE when showering: Minimal assistance;Patient able to independently direct caregiver Positioning of UE while sleeping: Minimal assistance;Patient able to independently direct caregiver    Home Living Family/patient expects to be discharged to:: Private residence Living Arrangements: Children Available Help at Discharge: Family Type of Home: House             Bathroom Shower/Tub: Chief Strategy Officer: Standard                Prior Functioning/Environment Level of Independence: Independent                          OT Goals(Current goals can be found in the care plan section) Acute Rehab OT Goals Patient Stated  Goal: home today OT Goal Formulation: With patient  OT Frequency:      AM-PAC OT "6 Clicks" Daily Activity     Outcome Measure Help from another person eating meals?: None Help from another person taking care of personal grooming?: None Help from another person toileting, which includes using toliet, bedpan, or urinal?: A Little Help from another person bathing (including washing, rinsing, drying)?: A Little Help from another  person to put on and taking off regular upper body clothing?: A Little Help from another person to put on and taking off regular lower body clothing?: A Little 6 Click Score: 20   End of Session Equipment Utilized During Treatment: Other (comment)(sling)  Activity Tolerance:   Patient left: in chair;with call bell/phone within reach                   Time: 1012-1045 OT Time Calculation (min): 33 min Charges:  OT General Charges $OT Visit: 1 Visit OT Treatments $Self Care/Home Management : 8-22 mins  Kari Baars, OT Acute Rehabilitation Services Pager505 [phone] Office- [phone]     Aimi Essner, Edwena Felty D 03/12/2019, 12:27 PM
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
## 2019-03-13 ENCOUNTER — Encounter (HOSPITAL_COMMUNITY): Payer: Self-pay | Admitting: Orthopedic Surgery

## 2019-07-04 NOTE — Patient Instructions (Addendum)
DUE TO COVID-19 ONLY ONE VISITOR IS ALLOWED TO COME WITH YOU AND STAY IN THE WAITING ROOM ONLY DURING PRE OP AND PROCEDURE DAY OF SURGERY. THE 1 VISITOR MAY VISIT WITH YOU AFTER SURGERY IN YOUR PRIVATE ROOM DURING VISITING HOURS ONLY!  YOU NEED TO HAVE A COVID 19 TEST ON_2/22______ @_2 :10______, THIS TEST MUST BE DONE BEFORE SURGERY, COME  [address],  [place] , .  [facility]) ONCE YOUR COVID TEST IS COMPLETED, PLEASE BEGIN THE QUARANTINE INSTRUCTIONS AS OUTLINED IN YOUR HANDOUT.                Amber Snyder    Your procedure is scheduled on: 07/10/19   Report to [facility] Main  Entrance   Report to admitting at 10:00 AM     Call this number if you have problems the morning of surgery [phone]     BRUSH YOUR TEETH MORNING OF SURGERY AND RINSE YOUR MOUTH OUT, NO CHEWING GUM CANDY OR MINTS.   Do not eat food After Midnight.   YOU MAY HAVE CLEAR LIQUIDS FROM MIDNIGHT UNTIL 9:30 AM    CLEAR LIQUID DIET   Foods Allowed                                                                     Foods Excluded  Coffee and tea, regular and decaf                             liquids that you cannot  Plain Jell-O any favor except red or purple                                           see through such as: Fruit ices (not with fruit pulp)                                     milk, soups, orange juice  Iced Popsicles                                    All solid food Carbonated beverages, regular and diet                                    Cranberry, grape and apple juices Sports drinks like Gatorade Lightly seasoned clear broth or consume(fat free) Sugar, honey syrup   . At 9:30 AM Please finish the prescribed Pre-Surgery  drink.   Nothing by mouth after you finish the  drink !   Take these medicines the morning of surgery with A SIP OF WATER: Amlodipine, Pain med if needed                                 You may not have any metal on  your body  including hair pins and              piercings  Do not wear jewelry, make-up, lotions, powders or perfumes, deodorant             Do not wear nail polish on your fingernails.  Do not shave  48 hours prior to surgery.        Do not bring valuables to the hospital. [facility] IS NOT             RESPONSIBLE   FOR VALUABLES.  Contacts, dentures or bridgework may not be worn into surgery.       Special Instructions: N/A              Please read over the following fact sheets you were given: _____________________________________________________________________ [facility]- Preparing for Total Shoulder Arthroplasty    Before surgery, you can play an important role. Because skin is not sterile, your skin needs to be as free of germs as possible. You can reduce the number of germs on your skin by using the following products. . Benzoyl Peroxide Gel o Reduces the number of germs present on the skin o Applied twice a day to shoulder area starting two days before surgery    ==================================================================  Please follow these instructions carefully:  BENZOYL PEROXIDE 5% GEL  Please do not use if you have an allergy to benzoyl peroxide.   If your skin becomes reddened/irritated stop using the benzoyl peroxide.  Starting two days before surgery, apply as follows: 1. Apply benzoyl peroxide in the morning and at night. Apply after taking a shower. If you are not taking a shower clean entire shoulder front, back, and side along with the armpit with a clean wet washcloth.  2. Place a quarter-sized dollop on your shoulder and rub in thoroughly, making sure to cover the front, back, and side of your shoulder, along with the armpit.   2 days before ____ AM   ____ PM              1 day before ____ AM   ____ PM                         3. Do this twice a day for two days.  (Last application is the night before surgery, AFTER using the CHG soap as described  below).  4. Do NOT apply benzoyl peroxide gel on the day of surgery.              - Preparing for Surgery  Before surgery, you can play an important role.   Because skin is not sterile, your skin needs to be as free of germs as possible.   You can reduce the number of germs on your skin by washing with CHG (chlorahexidine gluconate) soap before surgery.   CHG is an antiseptic cleaner which kills germs and bonds with the skin to continue killing germs even after washing. Please DO NOT use if you have an allergy to CHG or antibacterial soaps.   If your skin becomes reddened/irritated stop using the CHG and inform your nurse when you arrive at Short Stay. Do not shave (including legs and underarms) for at least 48 hours prior to the first CHG shower.   Please follow these instructions carefully:  1.  Shower with CHG Soap the night before surgery and the  morning of Surgery.  2.  If you choose to wash your  hair, wash your hair first as usual with your  normal  shampoo.  3.  After you shampoo, rinse your hair and body thoroughly to remove the  shampoo.                                        4.  Use CHG as you would any other liquid soap.  You can apply chg directly  to the skin and wash                       Gently with a scrungie or clean washcloth.  5.  Apply the CHG Soap to your body ONLY FROM THE NECK DOWN.   Do not use on face/ open                           Wound or open sores. Avoid contact with eyes, ears mouth and genitals (private parts).                       Wash face,  Genitals (private parts) with your normal soap.             6.  Wash thoroughly, paying special attention to the area where your surgery  will be performed.  7.  Thoroughly rinse your body with warm water from the neck down.  8.  DO NOT shower/wash with your normal soap after using and rinsing off  the CHG Soap.             9.  Pat yourself dry with a clean towel.            10.  Wear clean pajamas.             11.  Place clean sheets on your bed the night of your first shower and do not  sleep with pets. Day of Surgery : Do not apply any lotions/deodorants the morning of surgery.  Please wear clean clothes to the hospital/surgery center.  FAILURE TO FOLLOW THESE INSTRUCTIONS MAY RESULT IN THE CANCELLATION OF YOUR SURGERY PATIENT SIGNATURE_________________________________  NURSE SIGNATURE__________________________________  ________________________________________________________________________   Amber Snyder  An incentive spirometer is a tool that can help keep your lungs clear and active. This tool measures how well you are filling your lungs with each breath. Taking long deep breaths may help reverse or decrease the chance of developing breathing (pulmonary) problems (especially infection) following:  A long period of time when you are unable to move or be active. BEFORE THE PROCEDURE   If the spirometer includes an indicator to show your best effort, your nurse or respiratory therapist will set it to a desired goal.  If possible, sit up straight or lean slightly forward. Try not to slouch.  Hold the incentive spirometer in an upright position. INSTRUCTIONS FOR USE  1. Sit on the edge of your bed if possible, or sit up as far as you can in bed or on a chair. 2. Hold the incentive spirometer in an upright position. 3. Breathe out normally. 4. Place the mouthpiece in your mouth and seal your lips tightly around it. 5. Breathe in slowly and as deeply as possible, raising the piston or the ball toward the top of the column. 6. Hold your breath for 3-5 seconds or for as long as possible. Allow the  piston or ball to fall to the bottom of the column. 7. Remove the mouthpiece from your mouth and breathe out normally. 8. Rest for a few seconds and repeat Steps 1 through 7 at least 10 times every 1-2 hours when you are awake. Take your time and take a few normal breaths between deep  breaths. 9. The spirometer may include an indicator to show your best effort. Use the indicator as a goal to work toward during each repetition. 10. After each set of 10 deep breaths, practice coughing to be sure your lungs are clear. If you have an incision (the cut made at the time of surgery), support your incision when coughing by placing a pillow or rolled up towels firmly against it. Once you are able to get out of bed, walk around indoors and cough well. You may stop using the incentive spirometer when instructed by your caregiver.  RISKS AND COMPLICATIONS  Take your time so you do not get dizzy or light-headed.  If you are in pain, you may need to take or ask for pain medication before doing incentive spirometry. It is harder to take a deep breath if you are having pain. AFTER USE  Rest and breathe slowly and easily.  It can be helpful to keep track of a log of your progress. Your caregiver can provide you with a simple table to help with this. If you are using the spirometer at home, follow these instructions: SEEK MEDICAL CARE IF:   You are having difficultly using the spirometer.  You have trouble using the spirometer as often as instructed.  Your pain medication is not giving enough relief while using the spirometer.  You develop fever of 100.5 F (38.1 C) or higher. SEEK IMMEDIATE MEDICAL CARE IF:   You cough up bloody sputum that had not been present before.  You develop fever of 102 F (38.9 C) or greater.  You develop worsening pain at or near the incision site. MAKE SURE YOU:   Understand these instructions.  Will watch your condition.  Will get help right away if you are not doing well or get worse. Document Released: 09/11/2006 Document Revised: 07/24/2011 Document Reviewed: 11/12/2006 [facility] Patient Information 2014 [place], [place].   ________________________________________________________________________

## 2019-07-07 ENCOUNTER — Encounter (HOSPITAL_COMMUNITY): Payer: Self-pay

## 2019-07-07 ENCOUNTER — Other Ambulatory Visit: Payer: Self-pay

## 2019-07-07 ENCOUNTER — Encounter (HOSPITAL_COMMUNITY)
Admission: RE | Admit: 2019-07-07 | Discharge: 2019-07-07 | Disposition: A | Discharge status: Home / Self Care | Payer: Medicare Other | Source: Ambulatory Visit | Attending: Orthopedic Surgery | Admitting: Orthopedic Surgery

## 2019-07-07 ENCOUNTER — Other Ambulatory Visit (HOSPITAL_COMMUNITY)
Admission: RE | Admit: 2019-07-07 | Discharge: 2019-07-07 | Disposition: A | Discharge status: Home / Self Care | Payer: Medicare Other | Source: Ambulatory Visit | Attending: Orthopedic Surgery | Admitting: Orthopedic Surgery

## 2019-07-07 DIAGNOSIS — Z01810 Encounter for preprocedural cardiovascular examination: Secondary | ICD-10-CM | POA: Insufficient documentation

## 2019-07-07 DIAGNOSIS — Z01812 Encounter for preprocedural laboratory examination: Secondary | ICD-10-CM | POA: Diagnosis not present

## 2019-07-07 DIAGNOSIS — Z20822 Contact with and (suspected) exposure to covid-19: Secondary | ICD-10-CM | POA: Diagnosis not present

## 2019-07-07 DIAGNOSIS — I10 Essential (primary) hypertension: Secondary | ICD-10-CM | POA: Insufficient documentation

## 2019-07-07 DIAGNOSIS — Z01818 Encounter for other preprocedural examination: Secondary | ICD-10-CM | POA: Diagnosis present

## 2019-07-07 HISTORY — DX: Depression, unspecified: F32.A

## 2019-07-07 LAB — CBC
HCT: 47.7 % — ABNORMAL HIGH (ref 36.0–46.0)
Hemoglobin: 15.7 g/dL — ABNORMAL HIGH (ref 12.0–15.0)
MCH: 27.6 pg (ref 26.0–34.0)
MCHC: 32.9 g/dL (ref 30.0–36.0)
MCV: 83.8 fL (ref 80.0–100.0)
Platelets: 296 K/uL (ref 150–400)
RBC: 5.69 MIL/uL — ABNORMAL HIGH (ref 3.87–5.11)
RDW: 13.6 % (ref 11.5–15.5)
WBC: 7.5 K/uL (ref 4.0–10.5)
nRBC: 0 % (ref 0.0–0.2)

## 2019-07-07 LAB — BASIC METABOLIC PANEL
Anion gap: 8 (ref 5–15)
BUN: 16 mg/dL (ref 8–23)
CO2: 31 mmol/L (ref 22–32)
Calcium: 9.4 mg/dL (ref 8.9–10.3)
Chloride: 100 mmol/L (ref 98–111)
Creatinine, Ser: 0.72 mg/dL (ref 0.44–1.00)
GFR calc Af Amer: >60 mL/min (ref >60)
GFR calc non Af Amer: >60 mL/min (ref >60)
Glucose, Bld: 91 mg/dL (ref 70–99)
Potassium: 3.8 mmol/L (ref 3.5–5.1)
Sodium: 139 mmol/L (ref 135–145)

## 2019-07-07 LAB — SARS CORONAVIRUS 2 (TAT 6-24 HRS): SARS Coronavirus 2: NEGATIVE

## 2019-07-07 LAB — SURGICAL PCR SCREEN
MRSA, PCR: POSITIVE — AB
Staphylococcus aureus: POSITIVE — AB

## 2019-07-07 NOTE — SANE Note (Signed)
PCP -  Dr. Karrie Doffing Cardiologist - none  Chest x-ray - 2017 EKG - 07/07/19 Stress Test - no ECHO - no Cardiac Cath - no  Sleep Study - no CPAP -   Fasting Blood Sugar - NA Checks Blood Sugar _____ times a day  Blood Thinner Instructions:NA Aspirin Instructions: Last Dose:  Anesthesia review:   Patient denies shortness of breath, fever, cough and chest pain at PAT appointment  yes Patient verbalized understanding of instructions that were given to them at the PAT appointment. Patient was also instructed that they will need to review over the PAT instructions again at home before surgery. yes

## 2019-07-08 NOTE — Progress Notes (Signed)
PCR result routed to Dr. Rennis Chris for review

## 2019-07-10 ENCOUNTER — Other Ambulatory Visit: Payer: Self-pay

## 2019-07-10 ENCOUNTER — Encounter (HOSPITAL_COMMUNITY): Payer: Self-pay | Admitting: Orthopedic Surgery

## 2019-07-10 ENCOUNTER — Ambulatory Visit (HOSPITAL_COMMUNITY)
Admission: RE | Admit: 2019-07-10 | Discharge: 2019-07-11 | Disposition: A | Discharge status: Home / Self Care | Payer: Medicare Other | Source: Other Acute Inpatient Hospital | Attending: Orthopedic Surgery | Admitting: Orthopedic Surgery

## 2019-07-10 ENCOUNTER — Ambulatory Visit (HOSPITAL_COMMUNITY): Payer: Medicare Other | Admitting: Physician Assistant

## 2019-07-10 ENCOUNTER — Encounter (HOSPITAL_COMMUNITY)
Admission: RE | Disposition: A | Discharge status: Home / Self Care | Payer: Self-pay | Source: Other Acute Inpatient Hospital | Attending: Orthopedic Surgery

## 2019-07-10 ENCOUNTER — Ambulatory Visit (HOSPITAL_COMMUNITY): Payer: Medicare Other | Admitting: Anesthesiology

## 2019-07-10 DIAGNOSIS — K219 Gastro-esophageal reflux disease without esophagitis: Secondary | ICD-10-CM | POA: Diagnosis not present

## 2019-07-10 DIAGNOSIS — I10 Essential (primary) hypertension: Secondary | ICD-10-CM | POA: Insufficient documentation

## 2019-07-10 DIAGNOSIS — Z79899 Other long term (current) drug therapy: Secondary | ICD-10-CM | POA: Diagnosis not present

## 2019-07-10 DIAGNOSIS — Z96611 Presence of right artificial shoulder joint: Secondary | ICD-10-CM | POA: Diagnosis not present

## 2019-07-10 DIAGNOSIS — M19012 Primary osteoarthritis, left shoulder: Secondary | ICD-10-CM | POA: Diagnosis present

## 2019-07-10 DIAGNOSIS — F419 Anxiety disorder, unspecified: Secondary | ICD-10-CM | POA: Diagnosis not present

## 2019-07-10 DIAGNOSIS — Z96651 Presence of right artificial knee joint: Secondary | ICD-10-CM | POA: Insufficient documentation

## 2019-07-10 DIAGNOSIS — Z96612 Presence of left artificial shoulder joint: Secondary | ICD-10-CM

## 2019-07-10 HISTORY — PX: REVERSE SHOULDER ARTHROPLASTY: SHX5054

## 2019-07-10 SURGERY — ARTHROPLASTY, SHOULDER, TOTAL, REVERSE
Anesthesia: General | Site: Shoulder | Laterality: Left

## 2019-07-10 MED ORDER — MEPERIDINE HCL 50 MG/ML IJ SOLN: 6.2500 mg | INTRAMUSCULAR | Status: DC | PRN

## 2019-07-10 MED ORDER — PROPOFOL 10 MG/ML IV BOLUS
INTRAVENOUS | Status: DC | PRN
  Administered 2019-07-10: 140 mg via INTRAVENOUS

## 2019-07-10 MED ORDER — PANTOPRAZOLE SODIUM 40 MG PO TBEC
40.0000 mg | DELAYED_RELEASE_TABLET | Freq: Every day | ORAL | Status: DC
  Administered 2019-07-11: 09:00:00 40 mg via ORAL
  Filled 2019-07-10: qty 1

## 2019-07-10 MED ORDER — SUGAMMADEX SODIUM 200 MG/2ML IV SOLN
INTRAVENOUS | Status: DC | PRN
  Administered 2019-07-10: 200 mg via INTRAVENOUS

## 2019-07-10 MED ORDER — ACETAMINOPHEN 325 MG PO TABS: 325.0000 mg | ORAL_TABLET | Freq: Four times a day (QID) | ORAL | Status: DC | PRN

## 2019-07-10 MED ORDER — KETOROLAC TROMETHAMINE 15 MG/ML IJ SOLN
7.5000 mg | Freq: Four times a day (QID) | INTRAMUSCULAR | Status: DC
  Administered 2019-07-10 – 2019-07-11 (×3): 7.5 mg via INTRAVENOUS
  Filled 2019-07-10 (×3): qty 1

## 2019-07-10 MED ORDER — TRANEXAMIC ACID-NACL 1000-0.7 MG/100ML-% IV SOLN
1000.0000 mg | INTRAVENOUS | Status: AC
  Administered 2019-07-10: 13:00:00 1000 mg via INTRAVENOUS
  Filled 2019-07-10: qty 100

## 2019-07-10 MED ORDER — MENTHOL 3 MG MT LOZG: 1.0000 | LOZENGE | OROMUCOSAL | Status: DC | PRN

## 2019-07-10 MED ORDER — FENTANYL CITRATE (PF) 250 MCG/5ML IJ SOLN
INTRAMUSCULAR | Status: DC | PRN
  Administered 2019-07-10: 100 ug via INTRAVENOUS

## 2019-07-10 MED ORDER — CHLORHEXIDINE GLUCONATE 4 % EX LIQD: 60.0000 mL | Freq: Once | CUTANEOUS | Status: DC

## 2019-07-10 MED ORDER — METOCLOPRAMIDE HCL 5 MG PO TABS: 5.0000 mg | ORAL_TABLET | Freq: Three times a day (TID) | ORAL | Status: DC | PRN

## 2019-07-10 MED ORDER — METHOCARBAMOL 1000 MG/10ML IJ SOLN
500.0000 mg | Freq: Four times a day (QID) | INTRAVENOUS | Status: DC | PRN
  Filled 2019-07-10: qty 5

## 2019-07-10 MED ORDER — METHOCARBAMOL 500 MG PO TABS
500.0000 mg | ORAL_TABLET | Freq: Four times a day (QID) | ORAL | Status: DC | PRN
  Administered 2019-07-10 – 2019-07-11 (×2): 500 mg via ORAL
  Filled 2019-07-10 (×2): qty 1

## 2019-07-10 MED ORDER — BUPIVACAINE LIPOSOME 1.3 % IJ SUSP
INTRAMUSCULAR | Status: DC | PRN
  Administered 2019-07-10: 10 mL

## 2019-07-10 MED ORDER — FENTANYL CITRATE (PF) 100 MCG/2ML IJ SOLN
INTRAMUSCULAR | Status: AC
  Filled 2019-07-10: qty 2

## 2019-07-10 MED ORDER — HYDROCHLOROTHIAZIDE 25 MG PO TABS
25.0000 mg | ORAL_TABLET | Freq: Every day | ORAL | Status: DC
  Administered 2019-07-11: 09:00:00 25 mg via ORAL
  Filled 2019-07-10: qty 1

## 2019-07-10 MED ORDER — DOCUSATE SODIUM 100 MG PO CAPS
100.0000 mg | ORAL_CAPSULE | Freq: Two times a day (BID) | ORAL | Status: DC
  Administered 2019-07-11: 09:00:00 100 mg via ORAL
  Filled 2019-07-10 (×2): qty 1

## 2019-07-10 MED ORDER — PANTOPRAZOLE SODIUM 40 MG PO TBEC: 40.0000 mg | DELAYED_RELEASE_TABLET | Freq: Every day | ORAL | Status: DC

## 2019-07-10 MED ORDER — PHENYLEPHRINE HCL-NACL 10-0.9 MG/250ML-% IV SOLN
INTRAVENOUS | Status: DC | PRN
  Administered 2019-07-10: 50 ug/min via INTRAVENOUS

## 2019-07-10 MED ORDER — PHENOL 1.4 % MT LIQD: 1.0000 | OROMUCOSAL | Status: DC | PRN

## 2019-07-10 MED ORDER — ONDANSETRON HCL 4 MG/2ML IJ SOLN
INTRAMUSCULAR | Status: DC | PRN
  Administered 2019-07-10: 4 mg via INTRAVENOUS

## 2019-07-10 MED ORDER — ONDANSETRON HCL 4 MG/2ML IJ SOLN
INTRAMUSCULAR | Status: AC
  Filled 2019-07-10: qty 2

## 2019-07-10 MED ORDER — POLYETHYLENE GLYCOL 3350 17 G PO PACK: 17.0000 g | PACK | Freq: Every day | ORAL | Status: DC | PRN

## 2019-07-10 MED ORDER — AMLODIPINE BESYLATE 5 MG PO TABS
5.0000 mg | ORAL_TABLET | Freq: Every day | ORAL | Status: DC
  Administered 2019-07-11: 09:00:00 5 mg via ORAL
  Filled 2019-07-10: qty 1

## 2019-07-10 MED ORDER — METOCLOPRAMIDE HCL 5 MG/ML IJ SOLN: 5.0000 mg | Freq: Three times a day (TID) | INTRAMUSCULAR | Status: DC | PRN

## 2019-07-10 MED ORDER — LACTATED RINGERS IV SOLN: INTRAVENOUS | Status: DC

## 2019-07-10 MED ORDER — PHENYLEPHRINE HCL (PRESSORS) 10 MG/ML IV SOLN
INTRAVENOUS | Status: AC
  Filled 2019-07-10: qty 1

## 2019-07-10 MED ORDER — ALUM & MAG HYDROXIDE-SIMETH 200-200-20 MG/5ML PO SUSP: 30.0000 mL | ORAL | Status: DC | PRN

## 2019-07-10 MED ORDER — BISACODYL 5 MG PO TBEC: 5.0000 mg | DELAYED_RELEASE_TABLET | Freq: Every day | ORAL | Status: DC | PRN

## 2019-07-10 MED ORDER — VANCOMYCIN HCL IN DEXTROSE 1-5 GM/200ML-% IV SOLN
1000.0000 mg | INTRAVENOUS | Status: AC
  Administered 2019-07-10: 12:00:00 1000 mg via INTRAVENOUS
  Filled 2019-07-10: qty 200

## 2019-07-10 MED ORDER — ONDANSETRON HCL 4 MG/2ML IJ SOLN: 4.0000 mg | Freq: Four times a day (QID) | INTRAMUSCULAR | Status: DC | PRN

## 2019-07-10 MED ORDER — DIPHENHYDRAMINE HCL 12.5 MG/5ML PO ELIX: 12.5000 mg | ORAL_SOLUTION | ORAL | Status: DC | PRN

## 2019-07-10 MED ORDER — MAGNESIUM CITRATE PO SOLN: 1.0000 | Freq: Once | ORAL | Status: DC | PRN

## 2019-07-10 MED ORDER — HYDROMORPHONE HCL 1 MG/ML IJ SOLN: 0.2500 mg | INTRAMUSCULAR | Status: DC | PRN

## 2019-07-10 MED ORDER — SODIUM CHLORIDE 0.9 % IR SOLN
Status: DC | PRN
  Administered 2019-07-10: 1000 mL

## 2019-07-10 MED ORDER — DEXAMETHASONE SODIUM PHOSPHATE 10 MG/ML IJ SOLN
INTRAMUSCULAR | Status: AC
  Filled 2019-07-10: qty 1

## 2019-07-10 MED ORDER — ONDANSETRON HCL 4 MG PO TABS: 4.0000 mg | ORAL_TABLET | Freq: Four times a day (QID) | ORAL | Status: DC | PRN

## 2019-07-10 MED ORDER — ROCURONIUM BROMIDE 10 MG/ML (PF) SYRINGE
PREFILLED_SYRINGE | INTRAVENOUS | Status: AC
  Filled 2019-07-10: qty 10

## 2019-07-10 MED ORDER — ONDANSETRON HCL 4 MG/2ML IJ SOLN: 4.0000 mg | Freq: Once | INTRAMUSCULAR | Status: DC | PRN

## 2019-07-10 MED ORDER — OXYCODONE HCL 5 MG PO TABS
5.0000 mg | ORAL_TABLET | ORAL | Status: DC | PRN
  Administered 2019-07-11 (×2): 5 mg via ORAL
  Filled 2019-07-10 (×2): qty 1

## 2019-07-10 MED ORDER — DEXAMETHASONE SODIUM PHOSPHATE 10 MG/ML IJ SOLN
INTRAMUSCULAR | Status: DC | PRN
  Administered 2019-07-10: 4 mg via INTRAVENOUS

## 2019-07-10 MED ORDER — CLONAZEPAM 1 MG PO TABS
1.0000 mg | ORAL_TABLET | Freq: Two times a day (BID) | ORAL | Status: DC | PRN
  Administered 2019-07-10: 21:00:00 1 mg via ORAL
  Filled 2019-07-10: qty 1

## 2019-07-10 MED ORDER — LIDOCAINE 2% (20 MG/ML) 5 ML SYRINGE
INTRAMUSCULAR | Status: DC | PRN
  Administered 2019-07-10: 100 mg via INTRAVENOUS

## 2019-07-10 MED ORDER — OXYCODONE HCL 5 MG PO TABS: 10.0000 mg | ORAL_TABLET | ORAL | Status: DC | PRN

## 2019-07-10 MED ORDER — FENTANYL CITRATE (PF) 100 MCG/2ML IJ SOLN
50.0000 ug | INTRAMUSCULAR | Status: DC
  Administered 2019-07-10: 11:00:00 50 ug via INTRAVENOUS
  Filled 2019-07-10: qty 2

## 2019-07-10 MED ORDER — BUPIVACAINE-EPINEPHRINE (PF) 0.5% -1:200000 IJ SOLN
INTRAMUSCULAR | Status: DC | PRN
  Administered 2019-07-10: 20 mL via PERINEURAL

## 2019-07-10 MED ORDER — MIDAZOLAM HCL 2 MG/2ML IJ SOLN
1.0000 mg | INTRAMUSCULAR | Status: DC
  Administered 2019-07-10: 2 mg via INTRAVENOUS
  Filled 2019-07-10: qty 2

## 2019-07-10 MED ORDER — ROCURONIUM BROMIDE 10 MG/ML (PF) SYRINGE
PREFILLED_SYRINGE | INTRAVENOUS | Status: DC | PRN
  Administered 2019-07-10: 80 mg via INTRAVENOUS

## 2019-07-10 MED ORDER — PROPOFOL 10 MG/ML IV BOLUS
INTRAVENOUS | Status: AC
  Filled 2019-07-10: qty 20

## 2019-07-10 MED ORDER — CEFAZOLIN SODIUM-DEXTROSE 2-4 GM/100ML-% IV SOLN
2.0000 g | INTRAVENOUS | Status: AC
  Administered 2019-07-10: 13:00:00 2 g via INTRAVENOUS
  Filled 2019-07-10: qty 100

## 2019-07-10 MED ORDER — LIDOCAINE 2% (20 MG/ML) 5 ML SYRINGE
INTRAMUSCULAR | Status: AC
  Filled 2019-07-10: qty 5

## 2019-07-10 MED ORDER — HYDROMORPHONE HCL 1 MG/ML IJ SOLN: 0.5000 mg | INTRAMUSCULAR | Status: DC | PRN

## 2019-07-10 SURGICAL SUPPLY — 66 items
BAG ZIPLOCK 12X15 (MISCELLANEOUS) ×3 IMPLANT
BLADE SAW SGTL 83.5X18.5 (BLADE) ×3 IMPLANT
COOLER ICEMAN CLASSIC (MISCELLANEOUS) ×3 IMPLANT
COVER BACK TABLE 60X90IN (DRAPES) ×3 IMPLANT
COVER SURGICAL LIGHT HANDLE (MISCELLANEOUS) ×3 IMPLANT
COVER WAND RF STERILE (DRAPES) IMPLANT
CUP SUT UNIV REVERS 36 NEUTRAL (Cup) ×3 IMPLANT
DERMABOND ADVANCED (GAUZE/BANDAGES/DRESSINGS) ×2
DERMABOND ADVANCED .7 DNX12 (GAUZE/BANDAGES/DRESSINGS) ×1 IMPLANT
DRAPE INCISE IOBAN 66X45 STRL (DRAPES) ×3 IMPLANT
DRAPE ORTHO SPLIT 77X108 STRL (DRAPES) ×4
DRAPE SHEET LG 3/4 BI-LAMINATE (DRAPES) ×3 IMPLANT
DRAPE SURG 17X11 SM STRL (DRAPES) ×3 IMPLANT
DRAPE SURG ORHT 6 SPLT 77X108 (DRAPES) ×2 IMPLANT
DRAPE U-SHAPE 47X51 STRL (DRAPES) ×3 IMPLANT
DRESSING AQUACEL AG SP 3.5X10 (GAUZE/BANDAGES/DRESSINGS) ×1 IMPLANT
DRSG AQUACEL AG ADV 3.5X10 (GAUZE/BANDAGES/DRESSINGS) ×3 IMPLANT
DRSG AQUACEL AG SP 3.5X10 (GAUZE/BANDAGES/DRESSINGS) ×3
DURAPREP 26ML APPLICATOR (WOUND CARE) ×3 IMPLANT
ELECT BLADE TIP CTD 4 INCH (ELECTRODE) ×3 IMPLANT
ELECT REM PT RETURN 15FT ADLT (MISCELLANEOUS) ×3 IMPLANT
FACESHIELD WRAPAROUND (MASK) ×15 IMPLANT
GLENOID UNI REV MOD 24 +2 LAT (Joint) ×3 IMPLANT
GLENOSPHERE 36 +4 LAT/24 (Joint) ×3 IMPLANT
GLOVE BIO SURGEON STRL SZ7.5 (GLOVE) ×3 IMPLANT
GLOVE BIO SURGEON STRL SZ8 (GLOVE) ×3 IMPLANT
GLOVE SS BIOGEL STRL SZ 7 (GLOVE) ×1 IMPLANT
GLOVE SS BIOGEL STRL SZ 7.5 (GLOVE) ×1 IMPLANT
GLOVE SUPERSENSE BIOGEL SZ 7 (GLOVE) ×2
GLOVE SUPERSENSE BIOGEL SZ 7.5 (GLOVE) ×2
GLOVE SURG SYN 7.0 (GLOVE) IMPLANT
GLOVE SURG SYN 7.5  E (GLOVE)
GLOVE SURG SYN 7.5 E (GLOVE) IMPLANT
GLOVE SURG SYN 8.0 (GLOVE) IMPLANT
GOWN STRL REUS W/TWL LRG LVL3 (GOWN DISPOSABLE) ×6 IMPLANT
INSERT HUMERAL 36 +6 (Shoulder) ×3 IMPLANT
KIT BASIN OR (CUSTOM PROCEDURE TRAY) ×3 IMPLANT
KIT TURNOVER KIT A (KITS) IMPLANT
MANIFOLD NEPTUNE II (INSTRUMENTS) ×3 IMPLANT
NEEDLE TAPERED W/ NITINOL LOOP (MISCELLANEOUS) ×3 IMPLANT
NS IRRIG 1000ML POUR BTL (IV SOLUTION) ×3 IMPLANT
PACK SHOULDER (CUSTOM PROCEDURE TRAY) ×3 IMPLANT
PAD ARMBOARD 7.5X6 YLW CONV (MISCELLANEOUS) ×3 IMPLANT
PAD COLD SHLDR WRAP-ON (PAD) ×3 IMPLANT
PIN SET MODULAR GLENOID SYSTEM (PIN) ×3 IMPLANT
RESTRAINT HEAD UNIVERSAL NS (MISCELLANEOUS) ×3 IMPLANT
SCREW CENTRAL MOD 30MM (Screw) ×3 IMPLANT
SCREW PERIPHERAL 5.5X20 LOCK (Screw) ×6 IMPLANT
SCREW PERIPHERAL 5.5X28 LOCK (Screw) ×6 IMPLANT
SLING ARM FOAM STRAP LRG (SOFTGOODS) IMPLANT
SLING ARM FOAM STRAP MED (SOFTGOODS) ×3 IMPLANT
SPONGE LAP 18X18 RF (DISPOSABLE) ×3 IMPLANT
STEM HUMERAL UNI REVERS SZ6 (Stem) ×3 IMPLANT
SUCTION FRAZIER HANDLE 12FR (TUBING) ×2
SUCTION TUBE FRAZIER 12FR DISP (TUBING) ×1 IMPLANT
SUT FIBERWIRE #2 38 T-5 BLUE (SUTURE)
SUT MNCRL AB 3-0 PS2 18 (SUTURE) ×3 IMPLANT
SUT MON AB 2-0 CT1 36 (SUTURE) ×3 IMPLANT
SUT VIC AB 1 CT1 36 (SUTURE) ×3 IMPLANT
SUTURE FIBERWR #2 38 T-5 BLUE (SUTURE) IMPLANT
SUTURE TAPE 1.3 40 TPR END (SUTURE) ×2 IMPLANT
SUTURETAPE 1.3 40 TPR END (SUTURE) ×6
TOWEL OR 17X26 10 PK STRL BLUE (TOWEL DISPOSABLE) ×3 IMPLANT
TOWEL OR NON WOVEN STRL DISP B (DISPOSABLE) ×3 IMPLANT
WATER STERILE IRR 1000ML POUR (IV SOLUTION) ×6 IMPLANT
YANKAUER SUCT BULB TIP 10FT TU (MISCELLANEOUS) ×3 IMPLANT

## 2019-07-10 NOTE — Op Note (Signed)
07/10/2019  1:40 PM  PATIENT:   Amber Snyder  69 y.o. female  PRE-OPERATIVE DIAGNOSIS:  left shoulder advanced osteoarthritis  POST-OPERATIVE DIAGNOSIS: Same  PROCEDURE: Left shoulder reverse arthroplasty utilizing a press-fit size 6 Arthrex stem with a +6 polyethylene spacer, 36/+4 glenosphere on a small/+2 baseplate  SURGEON:  Baraka Klatt, Metta Clines M.D.  ASSISTANTS: Jenetta Loges, PA-C  ANESTHESIA:   General endotracheal and interscalene block with Exparel  EBL: 150 cc  SPECIMEN: None  Drains: None   PATIENT DISPOSITION:  PACU - hemodynamically stable.    PLAN OF CARE: Admit for overnight observation  Brief history:  Amber Snyder is a 69 year old female well-known to our practice after previous right shoulder reverse arthroplasty which she has done nicely with.  She is now presenting with chronic and progressively increasing left shoulder pain related to advanced arthritis.  Due to her increasing functional limitation she is brought to the operating this time for planned left shoulder reverse arthroplasty.  Preoperatively I counseled Amber Snyder regarding treatment options as well as the potential risks versus benefits thereof.  Possible surgical complications were reviewed including bleeding, infection, neurovascular injury, persistence of pain, loss of motion, anesthetic complication, failure of the implant, and possible need for additional surgery.  She understands, and accepts, and agrees with the plan procedure.  Procedure in detail:  After undergoing routine preop evaluation patient received prophylactic antibiotics and interscalene block with Exparel was established in the holding area by the anesthesia department.  Patient subsequently placed supine on the operating table and underwent the smooth induction of a general endotracheal anesthesia.  Placed into the beachchair position and appropriately padded and protected.  The left shoulder girdle region was sterilely  prepped and draped in standard fashion.  Timeout was called.  An anterior deltopectoral approach to the left shoulder is made through an approximate centimeter incision.  Skin flaps were elevated dissection carried deeply and electrocautery was used for hemostasis.  The deltopectoral interval was developed from proximal to distal with the vein taken laterally in the upper centimeter and a half of the pectoralis major was tenotomized to enhance exposure.  Conjoined tendon mobilized and retracted medially.  The long head biceps tendon was unroofed and tenodesed at the upper border the pectoralis major tendon and then it was excised proximally.  The rotator cuff was then split along the rotator interval towards the base of the coracoid and then was separated from the lesser tuberosity using electrocautery and the free margin was then tagged with a pair of suture tape sutures.  The capsular attachments were then divided from the anterior and inferior margins of the humeral neck utilizing subperiosteal dissection and the humeral head was then delivered through the wound.  The extra medullary guide was then used to outline the proposed humeral head resection which was performed with an oscillating saw at approximately 20 degrees retroversion.  Osteophytes in the margin of the humeral neck were then removed with rondure and a metal cap was placed over the cut proximal humeral surface and we then proceeded to expose the glenoid with appropriate retractors until appropriate visualization was achieved and a circumferential labral resection was performed.  Once complete visualization of the glenoid was achieved a guidepin was then directed into the center of the glenoid with an approximately 10 degree inferior tilt and the glenoid was then reamed with our central followed by peripheral reamer to a stable subchondral bony bed and all debris and peripheral bone fragments were removed.  The glenoid  was then terminally prepared  with our central drill hole followed by the tap and our implant was then assembled and inserted with excellent fit and fixation of the glenoid baseplate and then the peripheral locking screws were all then placed obtaining excellent purchase and fixation.  A 36/+4 glenosphere was then impacted onto the baseplate and a central locking screw was placed.  We then returned our attention back to the proximal humerus where the canal was prepared and we broached to a size 6 with excellent fit.  The metaphysis was then reamed with a neutral reamer.  A trial was placed and reduction showed good motion good soft tissue balance and good stability.  Our trial was then removed the final implant was assembled on the back table it was then impacted obtaining excellent fit and fixation.  We then performed a series of trial reductions and felt that the +6 polyethylene insert gave [place] the best soft tissue balance.  The final +6 polywas then inserted final reduction was then performed.  Copious irrigation was then completed.  The overall construct with stability motion and soft tissue balance was much to our satisfaction.  The subscapularis was then mobilized and repaired back to the eyelets on the collar of our implant utilizing the previously placed suture tape sutures.  Final irrigation was then completed.  The deltopectoral interval was reapproximated with a series of figure-of-eight #1 Vicryl sutures.  2-0 Vicryl used for subcu layer and intracuticular 3-0 Monocryl for the skin followed by Dermabond and Aquacel dressing.  Left arm placed into a sling and the patient was awakened, extubated, and taken to the recovery room in stable condition.  Ralene Bathe, PA-C was used as an Geophysicist/field seismologist throughout this case essential for help with positioning the patient, positioning of the extremity, tissue manipulation, implantation of the prosthesis, wound closure, and intraoperative decision-making.  Vania Rea Doshie Maggi MD   Contact #  [phone]

## 2019-07-10 NOTE — Transfer of Care (Signed)
Immediate Anesthesia Transfer of Care Note  Patient: RHYLEI MCQUAIG  Procedure(s) Performed: REVERSE SHOULDER ARTHROPLASTY (Left Shoulder)  Patient Location: PACU  Anesthesia Type:GA combined with regional for post-op pain  Level of Consciousness: sedated and responds to stimulation  Airway & Oxygen Therapy: Patient Spontanous Breathing and Patient connected to face mask oxygen  Post-op Assessment: Report given to RN and Post -op Vital signs reviewed and stable  Post vital signs: Reviewed and stable  Last Vitals:  Vitals Value Taken Time  BP    Temp    Pulse    Resp    SpO2      Last Pain:  Vitals:   07/10/19 1123  TempSrc:   PainSc: 0-No pain         Complications: No apparent anesthesia complications

## 2019-07-10 NOTE — Anesthesia Procedure Notes (Signed)
Anesthesia Regional Block: Interscalene brachial plexus block   Pre-Anesthetic Checklist: ,, timeout performed, Correct Patient, Correct Site, Correct Laterality, Correct Procedure, Correct Position, site marked, Risks and benefits discussed,  Surgical consent,  Pre-op evaluation,  At surgeon's request and post-op pain management  Laterality: Left  Prep: chloraprep       Needles:  Injection technique: Single-shot  Needle Type: Other     Needle Length: 9cm  Needle Gauge: 21   Needle insertion depth: 5 cm   Additional Needles:   Procedures:, nerve stimulator,,,,,,,  Narrative:  Start time: 07/10/2019 11:20 AM End time: 07/10/2019 11:30 AM Injection made incrementally with aspirations every 5 mL.  Performed by: Personally  Anesthesiologist: Arta Bruce, MD  Additional Notes: Monitors applied. Patient sedated. Sterile prep and drape,hand hygiene and sterile gloves were used. Needle position confirmed with evoked response at 0.4 mV.Local anesthetic injected incrementally after negative aspiration.Vascular puncture avoided. No complications. The patient tolerated the procedure well.
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
## 2019-07-10 NOTE — Anesthesia Procedure Notes (Signed)
Procedure Name: Intubation Date/Time: 07/10/2019 12:29 PM Performed by: Myna Bright, CRNA Pre-anesthesia Checklist: Patient identified, Emergency Drugs available, Suction available and Patient being monitored Patient Re-evaluated:Patient Re-evaluated prior to induction Oxygen Delivery Method: Circle system utilized Preoxygenation: Pre-oxygenation with 100% oxygen Induction Type: IV induction Ventilation: Mask ventilation without difficulty Laryngoscope Size: Mac and 3 Grade View: Grade I Tube type: Oral Tube size: 7.0 mm Number of attempts: 1 Airway Equipment and Method: Stylet Placement Confirmation: ETT inserted through vocal cords under direct vision,  positive ETCO2 and breath sounds checked- equal and bilateral Secured at: 21 cm Tube secured with: Tape Dental Injury: Teeth and Oropharynx as per pre-operative assessment

## 2019-07-10 NOTE — Discharge Instructions (Signed)
° °Kevin M. Supple, M.D., F.A.A.O.S. °Orthopaedic Surgery °Specializing in Arthroscopic and Reconstructive °Surgery of the Shoulder °[phone] °[address] - [place], [place] [address] - Fax [phone] ° ° °POST-OP TOTAL SHOULDER REPLACEMENT INSTRUCTIONS ° °1. Call the office at [phone] to schedule your first post-op appointment 10-14 days from the date of your surgery. ° °2. The bandage over your incision is waterproof. You may begin showering with this dressing on. You may leave this dressing on until first follow up appointment within 2 weeks. We prefer you leave this dressing in place until follow up however after 5-7 days if you are having itching or skin irritation and would like to remove it you may do so. Go slow and tug at the borders gently to break the bond the dressing has with the skin. At this point if there is no drainage it is okay to go without a bandage or you may cover it with a light guaze and tape. You can also expect significant bruising around your shoulder that will drift down your arm and into your chest wall. This is very normal and should resolve over several days. ° ° 3. Wear your sling/immobilizer at all times except to perform the exercises below or to occasionally let your arm dangle by your side to stretch your elbow. You also need to sleep in your sling immobilizer until instructed otherwise. It is ok to remove your sling if you are sitting in a controlled environment and allow your arm to rest in a position of comfort by your side or on your lap with pillows to give your neck and skin a break from the sling. You may remove it to allow arm to dangle by side to shower. If you are up walking around and when you go to sleep at night you need to wear it. ° °4. Range of motion to your elbow, wrist, and hand are encouraged 3-5 times daily. Exercise to your hand and fingers helps to reduce swelling you may experience. ° °5. Utilize ice to the shoulder 3-5 times  minimum a day and additionally if you are experiencing pain. ° °6. Prescriptions for a pain medication and a muscle relaxant are provided for you. It is recommended that if you are experiencing pain that you pain medication alone is not controlling, add the muscle relaxant along with the pain medication which can give additional pain relief. The first 1-2 days is generally the most severe of your pain and then should gradually decrease. As your pain lessens it is recommended that you decrease your use of the pain medications to an "as needed basis'" only and to always comply with the recommended dosages of the pain medications. ° °7. Pain medications can produce constipation along with their use. If you experience this, the use of an over the counter stool softener or laxative daily is recommended.  ° °8. For additional questions or concerns, please do not hesitate to call the office. If after hours there is an answering service to forward your concerns to the physician on call. ° °9.Pain control following an exparel block ° °To help control your post-operative pain you received a nerve block  performed with Exparel which is a long acting anesthetic (numbing agent) which can provide pain relief and sensations of numbness (and relief of pain) in the operative shoulder and arm for up to 3 days. Sometimes it provides mixed relief, meaning you may still have numbness in certain areas of the arm but can still   be able to move  parts of that arm, hand, and fingers. We recommend that your prescribed pain medications  be used as needed. We do not feel it is necessary to "pre medicate" and "stay ahead" of pain.  Taking narcotic pain medications when you are not having any pain can lead to unnecessary and potentially dangerous side effects.   ° °10. Use the ice machine as much as possible in the first 5-7 days from surgery, then you can wean its use to as needed. The ice typically needs to be replaced every 6 hours, instead of  ice you can actually freeze water bottles to put in the cooler and then fill water around them to avoid having to purchase ice. You can have spare water bottles freezing to allow you to rotate them once they have melted. Try to have a thin shirt or light cloth or towel under the ice wrap to protect your skin.  ° °11.  We recommend that you avoid any dental work or cleaning in the first 3 months following your joint replacement. This is to help minimize the possibility of infection from the bacteria in your mouth that enters your bloodstream during dental work. We also recommend that you take an antibiotic prior to your dental work for the first year after your shoulder replacement to further help reduce that risk. Please simply contact our office for antibiotics to be sent to your pharmacy prior to dental work. ° °POST-OP EXERCISES ° °Pendulum Exercises ° °Perform pendulum exercises while standing and bending at the waist. Support your uninvolved arm on a table or chair and allow your operated arm to hang freely. Make sure to do these exercises passively - not using you shoulder muscles. These exercises can be performed once your nerve block effects have worn off. ° °Repeat 20 times. Do 3 sessions per day. ° ° ° ° °

## 2019-07-10 NOTE — Progress Notes (Signed)
AssistedDr. Ossey with left, ultrasound guided, interscalene  block. Side rails up, monitors on throughout procedure. See vital signs in flow sheet. Tolerated Procedure well.  

## 2019-07-10 NOTE — Anesthesia Postprocedure Evaluation (Signed)
Anesthesia Post Note  Patient: MANASI DISHON  Procedure(s) Performed: REVERSE SHOULDER ARTHROPLASTY (Left Shoulder)     Patient location during evaluation: PACU Anesthesia Type: General Level of consciousness: awake and alert Pain management: pain level controlled Vital Signs Assessment: post-procedure vital signs reviewed and stable Respiratory status: spontaneous breathing, nonlabored ventilation, respiratory function stable and patient connected to nasal cannula oxygen Cardiovascular status: blood pressure returned to baseline and stable Postop Assessment: no apparent nausea or vomiting Anesthetic complications: no    Last Vitals:  Vitals:   07/10/19 1645 07/10/19 1735  BP: (!) 125/98 (!) 151/75  Pulse: 67 73  Resp: 18 18  Temp: 36.7 C 36.6 C  SpO2: 93% 92%    Last Pain:  Vitals:   07/10/19 1735  TempSrc: Oral  PainSc:                  Kastin Cerda DAVID

## 2019-07-10 NOTE — Anesthesia Preprocedure Evaluation (Signed)
Anesthesia Evaluation  Patient identified by MRN, date of birth, ID band Patient awake    Reviewed: Allergy & Precautions, NPO status , Patient's Chart, lab work & pertinent test results  Airway Mallampati: I  TM Distance: >3 FB Neck ROM: Full    Dental   Pulmonary    Pulmonary exam normal        Cardiovascular hypertension, Normal cardiovascular exam     Neuro/Psych Anxiety Depression    GI/Hepatic GERD  Medicated and Controlled,  Endo/Other    Renal/GU      Musculoskeletal   Abdominal   Peds  Hematology   Anesthesia Other Findings   Reproductive/Obstetrics                             Anesthesia Physical Anesthesia Plan  ASA: II  Anesthesia Plan: General   Post-op Pain Management:  Regional for Post-op pain   Induction: Intravenous  PONV Risk Score and Plan: 3  Airway Management Planned: Oral ETT  Additional Equipment:   Intra-op Plan:   Post-operative Plan: Extubation in OR  Informed Consent: I have reviewed the patients History and Physical, chart, labs and discussed the procedure including the risks, benefits and alternatives for the proposed anesthesia with the patient or authorized representative who has indicated his/her understanding and acceptance.       Plan Discussed with: Surgeon and CRNA  Anesthesia Plan Comments:         Anesthesia Quick Evaluation

## 2019-07-11 ENCOUNTER — Encounter: Payer: Self-pay | Admitting: *Deleted

## 2019-07-11 DIAGNOSIS — M19012 Primary osteoarthritis, left shoulder: Secondary | ICD-10-CM | POA: Diagnosis not present

## 2019-07-11 MED ORDER — OXYCODONE-ACETAMINOPHEN 5-325 MG PO TABS: 1.0000 | ORAL_TABLET | ORAL | 0 refills | Status: DC | PRN

## 2019-07-11 MED ORDER — NAPROXEN 500 MG PO TABS: 500.0000 mg | ORAL_TABLET | Freq: Two times a day (BID) | ORAL | 1 refills | Status: DC

## 2019-07-11 MED ORDER — CYCLOBENZAPRINE HCL 10 MG PO TABS: 10.0000 mg | ORAL_TABLET | Freq: Three times a day (TID) | ORAL | 1 refills | Status: DC | PRN

## 2019-07-11 MED ORDER — ONDANSETRON HCL 4 MG PO TABS: 4.0000 mg | ORAL_TABLET | Freq: Three times a day (TID) | ORAL | 0 refills | Status: DC | PRN

## 2019-07-11 NOTE — Plan of Care (Signed)
  Problem: Clinical Measurements: Goal: Ability to maintain clinical measurements within normal limits will improve Outcome: Progressing Goal: Will remain free from infection Outcome: Progressing Goal: Diagnostic test results will improve Outcome: Progressing Goal: Respiratory complications will improve Outcome: Progressing Goal: Cardiovascular complication will be avoided Outcome: Progressing   Problem: Activity: Goal: Risk for activity intolerance will decrease Outcome: Progressing   Problem: Nutrition: Goal: Adequate nutrition will be maintained Outcome: Progressing   Problem: Coping: Goal: Level of anxiety will decrease Outcome: Progressing   Problem: Elimination: Goal: Will not experience complications related to bowel motility Outcome: Progressing Goal: Will not experience complications related to urinary retention Outcome: Progressing   Problem: Pain Managment: Goal: General experience of comfort will improve Outcome: Progressing   Problem: Safety: Goal: Ability to remain free from injury will improve Outcome: Progressing   Problem: Skin Integrity: Goal: Risk for impaired skin integrity will decrease Outcome: Progressing   Problem: Education: Goal: Knowledge of the prescribed therapeutic regimen will improve Outcome: Progressing Goal: Understanding of activity limitations/precautions following surgery will improve Outcome: Progressing   Problem: Activity: Goal: Ability to tolerate increased activity will improve Outcome: Progressing   Problem: Pain Management: Goal: Pain level will decrease with appropriate interventions Outcome: Progressing

## 2019-07-11 NOTE — Progress Notes (Signed)
Occupational Therapy Evaluation Patient Details Name: Amber Snyder MRN: [id number] DOB: 1951/03/23 Today's Date: 07/11/2019    History of Present Illness Amber Snyder is a 69 year old female well-known to our practice after previous right shoulder reverse arthroplasty which she has done nicely with.  She is now presenting with chronic and progressively increasing left shoulder pain related to advanced arthritis.  Due to her increasing functional limitation she is brought to the operating this time for planned left shoulder reverse arthroplasty.   Clinical Impression   Patient resides home alone and was performing all self-care tasks and functional mobility with Independence at PLOF. Patient plans to discharge to daughter's home with 24 hour caregiver assistance from family. Patient was given a demonstration of proper L UE sling management, L UE exercises with wrist/hand/elbow, pendulum exercises, and L UE restrictions with ROM limitations. Patient verbalized and demonstrated understanding of surgeon L UE recommendations with assistance from handouts. Patient performs all self-care tasks with set-up to touching assist level for donning of UE garments only. Patient will be discharging with family assistance and use of Adaptive devices from home setting.     Follow Up Recommendations  Follow surgeon's recommendation for DC plan and follow-up therapies;Supervision - Intermittent    Equipment Recommendations  None recommended by OT    Recommendations for Other Services       Precautions / Restrictions Precautions Precautions: Shoulder Type of Shoulder Precautions: Anterior shoulder arthroplasty Shoulder Interventions: Shoulder sling/immobilizer;Off for dressing/bathing/exercises Precaution Booklet Issued: Yes (comment) Precaution Comments: No internal rotation, external rotation to 20 degrees, forward flexion to 60 degrees, AROM of wrist  hand and elbow of L UE only  Required Braces or  Orthoses: Sling Restrictions Weight Bearing Restrictions: Yes LUE Weight Bearing: Non weight bearing      Mobility Bed Mobility Overal bed mobility: Modified Independent                Transfers Overall transfer level: Modified independent                    Balance Overall balance assessment: Modified Independent                                         ADL either performed or assessed with clinical judgement   ADL Overall ADL's : Needs assistance/impaired Eating/Feeding: Modified independent   Grooming: Modified independent   Upper Body Bathing: Set up   Lower Body Bathing: Set up   Upper Body Dressing : Min guard   Lower Body Dressing: Set up   Toilet Transfer: Modified Independent   Toileting- Clothing Manipulation and Hygiene: Modified independent   Tub/ Shower Transfer: Supervision/safety   Functional mobility during ADLs: Modified independent       Vision Baseline Vision/History: Wears glasses Wears Glasses: At all times Vision Assessment?: No apparent visual deficits     Perception     Praxis      Pertinent Vitals/Pain Pain Assessment: 0-10 Pain Score: 6  Pain Location: left shoulder  Pain Descriptors / Indicators: Discomfort Pain Intervention(s): Repositioned;Patient requesting pain meds-RN notified     Hand Dominance Right   Extremity/Trunk Assessment Upper Extremity Assessment Upper Extremity Assessment: LUE deficits/detail LUE Deficits / Details: not accurately assessed due to immobilization, elbow/wrist/hand [facility]           Communication Communication Communication: No difficulties   Cognition Arousal/Alertness: Awake/alert Behavior During Therapy: [facility]  for tasks assessed/performed Overall Cognitive Status: Within Functional Limits for tasks assessed                                     General Comments  swelling noted in L UE shoulder     Exercises Exercises: Shoulder Shoulder  Exercises Pendulum Exercise: PROM;10 reps Elbow Flexion: AROM Elbow Extension: AROM Wrist Flexion: AROM Wrist Extension: AROM   Shoulder Instructions Shoulder Instructions Donning/doffing shirt without moving shoulder: Min-guard Method for sponge bathing under operated UE: Modified independent Donning/doffing sling/immobilizer: Min-guard Correct positioning of sling/immobilizer: Min-guard Pendulum exercises (written home exercise program): Supervision/safety ROM for elbow, wrist and digits of operated UE: Modified independent Sling wearing schedule (on at all times/off for ADL's): Modified independent Proper positioning of operated UE when showering: Supervision/safety Dressing change: (no dressing change until follow with physician) Positioning of UE while sleeping: Supervision/safety    Home Living Family/patient expects to be discharged to:: Private residence Living Arrangements: Children;Other relatives Available Help at Discharge: Family Type of Home: House Home Access: Level entry     Home Layout: One level     Bathroom Shower/Tub: Chief Strategy Officer: Standard Bathroom Accessibility: Yes How Accessible: Accessible via walker Home Equipment: Hospital bed;Bedside commode   Additional Comments: plan to discharge to daughter's home       Prior Functioning/Environment Level of Independence: Independent                 OT Problem List:        OT Treatment/Interventions:      OT Goals(Current goals can be found in the care plan section)    OT Frequency:     Barriers to D/C:            Co-evaluation              AM-PAC OT "6 Clicks" Daily Activity     Outcome Measure Help from another person eating meals?: None Help from another person taking care of personal grooming?: None Help from another person toileting, which includes using toliet, bedpan, or urinal?: None Help from another person bathing (including washing, rinsing,  drying)?: A Little Help from another person to put on and taking off regular upper body clothing?: A Little Help from another person to put on and taking off regular lower body clothing?: None 6 Click Score: 22   End of Session Nurse Communication: Patient requests pain meds  Activity Tolerance: Patient tolerated treatment well Patient left: in bed;with bed alarm set                   Time: 0940-1006 OT Time Calculation (min): 26 min Charges:  OT General Charges $OT Visit: 1 Visit OT Evaluation $OT Eval Low Complexity: 1 Low OT Treatments $Therapeutic Exercise: 23-37 mins  Hashir Deleeuw OTR/L   Mayu Ronk 07/11/2019, 10:27 AM

## 2019-07-11 NOTE — Plan of Care (Signed)
resolved 

## 2020-05-28 ENCOUNTER — Encounter (HOSPITAL_COMMUNITY): Admission: RE | Admit: 2020-05-28 | Payer: Medicare Other | Source: Ambulatory Visit

## 2020-05-28 ENCOUNTER — Other Ambulatory Visit (HOSPITAL_COMMUNITY): Payer: 59

## 2020-06-23 NOTE — Progress Notes (Signed)
DUE TO COVID-19 ONLY ONE VISITOR IS ALLOWED TO COME WITH YOU AND STAY IN THE WAITING ROOM ONLY DURING PRE OP AND PROCEDURE DAY OF SURGERY. THE 1 VISITOR  MAY VISIT WITH YOU AFTER SURGERY IN YOUR PRIVATE ROOM DURING VISITING HOURS ONLY!  YOU NEED TO HAVE A COVID 19 TEST ON__2/03/2021 _____ @_______ , THIS TEST MUST BE DONE BEFORE SURGERY,  COVID TESTING SITE [address] [place] , IT IS ON THE RIGHT GOING OUT [address] APPROXIMATELY  2 MINUTES PAST [employer] ON THE RIGHT. ONCE YOUR COVID TEST IS COMPLETED,  PLEASE BEGIN THE QUARANTINE INSTRUCTIONS AS OUTLINED IN YOUR [id number]                 Amber Snyder  06/23/2020   Your procedure is scheduled on: 06/29/2020    Report to [facility] Main  Entrance   Report to admitting at     1030 AM     Call this number if you have problems the morning of surgery [phone]    REMEMBER: NO  SOLID FOOD CANDY OR GUM AFTER MIDNIGHT. CLEAR LIQUIDS UNTIL         . NOTHING BY MOUTH EXCEPT CLEAR LIQUIDS UNTIL    . PLEASE FINISH ENSURE DRINK PER SURGEON ORDER  WHICH NEEDS TO BE COMPLETED AT      .      CLEAR LIQUID DIET   Foods Allowed                                                                    Coffee and tea, regular and decaf                            Fruit ices (not with fruit pulp)                                      Iced Popsicles                                    Carbonated beverages, regular and diet                                    Cranberry, grape and apple juices Sports drinks like Gatorade Lightly seasoned clear broth or consume(fat free) Sugar, honey syrup ___________________________________________________________________      BRUSH YOUR TEETH MORNING OF SURGERY AND RINSE YOUR MOUTH OUT, NO CHEWING GUM CANDY OR MINTS.     Take these medicines the morning of surgery with A SIP OF WATER: amlodipine, clonazepam, if needed, prilosec   DO NOT TAKE ANY DIABETIC  MEDICATIONS DAY OF YOUR SURGERY                               You may not have any metal on your body including hair pins and  piercings  Do not wear jewelry, make-up, lotions, powders or perfumes, deodorant             Do not wear nail polish on your fingernails.  Do not shave  48 hours prior to surgery.              Men may shave face and neck.   Do not bring valuables to the hospital. [place].  Contacts, dentures or bridgework may not be worn into surgery.  Leave suitcase in the car. After surgery it may be brought to your room.     Patients discharged the day of surgery will not be allowed to drive home. IF YOU ARE HAVING SURGERY AND GOING HOME THE SAME DAY, YOU MUST HAVE AN ADULT TO DRIVE YOU HOME AND BE WITH YOU FOR 24 HOURS. YOU MAY GO HOME BY TAXI OR UBER OR ORTHERWISE, BUT AN ADULT MUST ACCOMPANY YOU HOME AND STAY WITH YOU FOR 24 HOURS.  Name and phone number of your driver:  Special Instructions: N/A              Please read over the following fact sheets you were given: _____________________________________________________________________  [facility] - Preparing for Surgery Before surgery, you can play an important role.  Because skin is not sterile, your skin needs to be as free of germs as possible.  You can reduce the number of germs on your skin by washing with CHG (chlorahexidine gluconate) soap before surgery.  CHG is an antiseptic cleaner which kills germs and bonds with the skin to continue killing germs even after washing. Please DO NOT use if you have an allergy to CHG or antibacterial soaps.  If your skin becomes reddened/irritated stop using the CHG and inform your nurse when you arrive at Short Stay. Do not shave (including legs and underarms) for at least 48 hours prior to the first CHG shower.  You may shave your face/neck. Please follow these instructions carefully:  1.  Shower with CHG Soap the night  before surgery and the  morning of Surgery.  2.  If you choose to wash your hair, wash your hair first as usual with your  normal  shampoo.  3.  After you shampoo, rinse your hair and body thoroughly to remove the  shampoo.                           4.  Use CHG as you would any other liquid soap.  You can apply chg directly  to the skin and wash                       Gently with a scrungie or clean washcloth.  5.  Apply the CHG Soap to your body ONLY FROM THE NECK DOWN.   Do not use on face/ open                           Wound or open sores. Avoid contact with eyes, ears mouth and genitals (private parts).                       Wash face,  Genitals (private parts) with your normal soap.             6.  Wash thoroughly, paying special attention to the area where your surgery  will be performed.  7.  Thoroughly rinse your body with warm water from the neck down.  8.  DO NOT shower/wash with your normal soap after using and rinsing off  the CHG Soap.                9.  Pat yourself dry with a clean towel.            10.  Wear clean pajamas.            11.  Place clean sheets on your bed the night of your first shower and do not  sleep with pets. Day of Surgery : Do not apply any lotions/deodorants the morning of surgery.  Please wear clean clothes to the hospital/surgery center.  FAILURE TO FOLLOW THESE INSTRUCTIONS MAY RESULT IN THE CANCELLATION OF YOUR SURGERY PATIENT SIGNATURE_________________________________  NURSE SIGNATURE__________________________________  ________________________________________________________________________

## 2020-06-24 ENCOUNTER — Encounter (HOSPITAL_COMMUNITY)
Admission: RE | Admit: 2020-06-24 | Discharge: 2020-06-24 | Disposition: A | Discharge status: Home / Self Care | Payer: Medicare Other | Source: Ambulatory Visit | Attending: Orthopedic Surgery | Admitting: Orthopedic Surgery

## 2020-06-24 ENCOUNTER — Encounter (HOSPITAL_COMMUNITY): Payer: Self-pay

## 2020-06-24 ENCOUNTER — Other Ambulatory Visit: Payer: Self-pay

## 2020-06-24 DIAGNOSIS — Z01812 Encounter for preprocedural laboratory examination: Secondary | ICD-10-CM | POA: Diagnosis present

## 2020-06-24 HISTORY — DX: Fibromyalgia: M79.7

## 2020-06-24 HISTORY — DX: Unspecified osteoarthritis, unspecified site: M19.90

## 2020-06-24 LAB — CBC
HCT: 44.6 % (ref 36.0–46.0)
Hemoglobin: 14.9 g/dL (ref 12.0–15.0)
MCH: 28.5 pg (ref 26.0–34.0)
MCHC: 33.4 g/dL (ref 30.0–36.0)
MCV: 85.4 fL (ref 80.0–100.0)
Platelets: 276 10*3/uL (ref 150–400)
RBC: 5.22 MIL/uL — ABNORMAL HIGH (ref 3.87–5.11)
RDW: 13.4 % (ref 11.5–15.5)
WBC: 6.3 10*3/uL (ref 4.0–10.5)
nRBC: 0 % (ref 0.0–0.2)

## 2020-06-24 LAB — BASIC METABOLIC PANEL
Anion gap: 10 (ref 5–15)
BUN: 15 mg/dL (ref 8–23)
CO2: 29 mmol/L (ref 22–32)
Calcium: 8.8 mg/dL — ABNORMAL LOW (ref 8.9–10.3)
Chloride: 99 mmol/L (ref 98–111)
Creatinine, Ser: 0.92 mg/dL (ref 0.44–1.00)
GFR, Estimated: >60 mL/min (ref >60)
Glucose, Bld: 103 mg/dL — ABNORMAL HIGH (ref 70–99)
Potassium: 3 mmol/L — ABNORMAL LOW (ref 3.5–5.1)
Sodium: 138 mmol/L (ref 135–145)

## 2020-06-24 NOTE — Progress Notes (Addendum)
Anesthesia Review:  PCP: DR Lynett Grimes  Lov 04/2020  Pt states she received clearance -  Called Sherrie at [facility] for clearance note.  Cardiologist : Chest  EKG-07/07/19  Echo : Stress test: Cardiac Cath :  Activity level: can do a flight of stairs without difficulty  Sleep Study/ CPAP : no  Fasting Blood Sugar :      / Checks Blood Sugar -- times a day:   Blood Thinner/ Instructions /Last Dose: ASA / Instructions/ Last Dose :  BMP routed to DR Charlann Boxer done 06/24/20 Sheilah Mins made aware potaswsium at 3.0 on 06/24/2020 labs.  Pt is on no potassium supplemetnt and takes daily HCTZ.  . No new orders given

## 2020-06-25 ENCOUNTER — Other Ambulatory Visit (HOSPITAL_COMMUNITY)
Admission: RE | Admit: 2020-06-25 | Discharge: 2020-06-25 | Disposition: A | Discharge status: Home / Self Care | Payer: Medicare Other | Source: Ambulatory Visit | Attending: Orthopedic Surgery | Admitting: Orthopedic Surgery

## 2020-06-25 DIAGNOSIS — Z20822 Contact with and (suspected) exposure to covid-19: Secondary | ICD-10-CM | POA: Insufficient documentation

## 2020-06-25 DIAGNOSIS — Z01812 Encounter for preprocedural laboratory examination: Secondary | ICD-10-CM | POA: Diagnosis present

## 2020-06-25 LAB — SARS CORONAVIRUS 2 (TAT 6-24 HRS): SARS Coronavirus 2: NEGATIVE

## 2020-06-25 LAB — SURGICAL PCR SCREEN
MRSA, PCR: POSITIVE — AB
Staphylococcus aureus: POSITIVE — AB

## 2020-06-25 NOTE — Progress Notes (Signed)
PCR results 06/24/20 faxed to Dr. Charlann Boxer via epic.
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
## 2020-06-29 ENCOUNTER — Other Ambulatory Visit: Payer: Self-pay

## 2020-06-29 ENCOUNTER — Encounter (HOSPITAL_COMMUNITY): Payer: Self-pay | Admitting: Orthopedic Surgery

## 2020-06-29 ENCOUNTER — Encounter (HOSPITAL_COMMUNITY)
Admission: RE | Disposition: A | Discharge status: Home / Self Care | Payer: Self-pay | Source: Home / Self Care | Attending: Orthopedic Surgery

## 2020-06-29 ENCOUNTER — Ambulatory Visit (HOSPITAL_COMMUNITY): Payer: Medicare Other | Admitting: Anesthesiology

## 2020-06-29 ENCOUNTER — Ambulatory Visit (HOSPITAL_COMMUNITY): Payer: Medicare Other | Admitting: Physician Assistant

## 2020-06-29 ENCOUNTER — Observation Stay (HOSPITAL_COMMUNITY)
Admission: RE | Admit: 2020-06-29 | Discharge: 2020-06-30 | Disposition: A | Discharge status: Home / Self Care | Payer: Medicare Other | Attending: Orthopedic Surgery | Admitting: Orthopedic Surgery

## 2020-06-29 DIAGNOSIS — Z79899 Other long term (current) drug therapy: Secondary | ICD-10-CM | POA: Diagnosis not present

## 2020-06-29 DIAGNOSIS — E669 Obesity, unspecified: Secondary | ICD-10-CM | POA: Diagnosis present

## 2020-06-29 DIAGNOSIS — Z96651 Presence of right artificial knee joint: Secondary | ICD-10-CM | POA: Diagnosis not present

## 2020-06-29 DIAGNOSIS — M25562 Pain in left knee: Secondary | ICD-10-CM | POA: Diagnosis present

## 2020-06-29 DIAGNOSIS — M1712 Unilateral primary osteoarthritis, left knee: Principal | ICD-10-CM | POA: Insufficient documentation

## 2020-06-29 DIAGNOSIS — I10 Essential (primary) hypertension: Secondary | ICD-10-CM | POA: Insufficient documentation

## 2020-06-29 DIAGNOSIS — Z96652 Presence of left artificial knee joint: Secondary | ICD-10-CM

## 2020-06-29 HISTORY — PX: TOTAL KNEE ARTHROPLASTY: SHX125

## 2020-06-29 LAB — TYPE AND SCREEN
ABO/RH(D): O POS
Antibody Screen: NEGATIVE

## 2020-06-29 SURGERY — ARTHROPLASTY, KNEE, TOTAL
Anesthesia: Spinal | Site: Knee | Laterality: Left

## 2020-06-29 MED ORDER — FENTANYL CITRATE (PF) 100 MCG/2ML IJ SOLN
50.0000 ug | INTRAMUSCULAR | Status: DC
  Administered 2020-06-29: 50 ug via INTRAVENOUS
  Filled 2020-06-29: qty 2

## 2020-06-29 MED ORDER — TRANEXAMIC ACID-NACL 1000-0.7 MG/100ML-% IV SOLN
1000.0000 mg | Freq: Once | INTRAVENOUS | Status: AC
  Administered 2020-06-29: 1000 mg via INTRAVENOUS
  Filled 2020-06-29: qty 100

## 2020-06-29 MED ORDER — ASPIRIN 81 MG PO CHEW
81.0000 mg | CHEWABLE_TABLET | Freq: Two times a day (BID) | ORAL | Status: DC
  Administered 2020-06-29 – 2020-06-30 (×2): 81 mg via ORAL
  Filled 2020-06-29 (×2): qty 1

## 2020-06-29 MED ORDER — CEFAZOLIN SODIUM-DEXTROSE 2-4 GM/100ML-% IV SOLN
2.0000 g | Freq: Four times a day (QID) | INTRAVENOUS | Status: AC
  Administered 2020-06-29 (×2): 2 g via INTRAVENOUS
  Filled 2020-06-29 (×2): qty 100

## 2020-06-29 MED ORDER — TRANEXAMIC ACID-NACL 1000-0.7 MG/100ML-% IV SOLN
1000.0000 mg | INTRAVENOUS | Status: AC
  Administered 2020-06-29: 1000 mg via INTRAVENOUS
  Filled 2020-06-29: qty 100

## 2020-06-29 MED ORDER — MEPERIDINE HCL 50 MG/ML IJ SOLN: 6.2500 mg | INTRAMUSCULAR | Status: DC | PRN

## 2020-06-29 MED ORDER — METHOCARBAMOL 500 MG PO TABS
500.0000 mg | ORAL_TABLET | Freq: Four times a day (QID) | ORAL | Status: DC | PRN
  Administered 2020-06-29 – 2020-06-30 (×3): 500 mg via ORAL
  Filled 2020-06-29 (×3): qty 1

## 2020-06-29 MED ORDER — DEXAMETHASONE SODIUM PHOSPHATE 10 MG/ML IJ SOLN
10.0000 mg | Freq: Once | INTRAMUSCULAR | Status: AC
  Administered 2020-06-30: 10 mg via INTRAVENOUS
  Filled 2020-06-29: qty 1

## 2020-06-29 MED ORDER — ONDANSETRON HCL 4 MG/2ML IJ SOLN
INTRAMUSCULAR | Status: DC | PRN
  Administered 2020-06-29: 4 mg via INTRAVENOUS

## 2020-06-29 MED ORDER — ONDANSETRON HCL 4 MG/2ML IJ SOLN: 4.0000 mg | Freq: Four times a day (QID) | INTRAMUSCULAR | Status: DC | PRN

## 2020-06-29 MED ORDER — ACETAMINOPHEN 325 MG PO TABS: 325.0000 mg | ORAL_TABLET | Freq: Once | ORAL | Status: DC | PRN

## 2020-06-29 MED ORDER — PHENYLEPHRINE 40 MCG/ML (10ML) SYRINGE FOR IV PUSH (FOR BLOOD PRESSURE SUPPORT)
PREFILLED_SYRINGE | INTRAVENOUS | Status: AC
  Filled 2020-06-29: qty 10

## 2020-06-29 MED ORDER — ACETAMINOPHEN 500 MG PO TABS
1000.0000 mg | ORAL_TABLET | Freq: Four times a day (QID) | ORAL | Status: DC
  Administered 2020-06-29 – 2020-06-30 (×4): 1000 mg via ORAL
  Filled 2020-06-29 (×4): qty 2

## 2020-06-29 MED ORDER — PHENOL 1.4 % MT LIQD: 1.0000 | OROMUCOSAL | Status: DC | PRN

## 2020-06-29 MED ORDER — KETOROLAC TROMETHAMINE 30 MG/ML IJ SOLN
INTRAMUSCULAR | Status: AC
  Filled 2020-06-29: qty 1

## 2020-06-29 MED ORDER — MIDAZOLAM HCL 2 MG/2ML IJ SOLN
1.0000 mg | INTRAMUSCULAR | Status: DC
  Administered 2020-06-29: 1 mg via INTRAVENOUS
  Filled 2020-06-29: qty 2

## 2020-06-29 MED ORDER — ALUM & MAG HYDROXIDE-SIMETH 200-200-20 MG/5ML PO SUSP: 15.0000 mL | ORAL | Status: DC | PRN

## 2020-06-29 MED ORDER — OXYCODONE HCL 5 MG PO TABS: 5.0000 mg | ORAL_TABLET | ORAL | Status: DC | PRN

## 2020-06-29 MED ORDER — FERROUS SULFATE 325 (65 FE) MG PO TABS
325.0000 mg | ORAL_TABLET | Freq: Two times a day (BID) | ORAL | Status: DC
  Administered 2020-06-30: 325 mg via ORAL
  Filled 2020-06-29: qty 1

## 2020-06-29 MED ORDER — CELECOXIB 200 MG PO CAPS
200.0000 mg | ORAL_CAPSULE | Freq: Two times a day (BID) | ORAL | Status: DC
  Administered 2020-06-29 – 2020-06-30 (×2): 200 mg via ORAL
  Filled 2020-06-29 (×2): qty 1

## 2020-06-29 MED ORDER — POVIDONE-IODINE 10 % EX SWAB
2.0000 "application " | Freq: Once | CUTANEOUS | Status: AC
  Administered 2020-06-29: 2 via TOPICAL

## 2020-06-29 MED ORDER — LACTATED RINGERS IV SOLN: INTRAVENOUS | Status: DC

## 2020-06-29 MED ORDER — HYDROCHLOROTHIAZIDE 25 MG PO TABS
25.0000 mg | ORAL_TABLET | Freq: Every day | ORAL | Status: DC
  Filled 2020-06-29 (×2): qty 1

## 2020-06-29 MED ORDER — ONDANSETRON HCL 4 MG/2ML IJ SOLN
INTRAMUSCULAR | Status: AC
  Filled 2020-06-29: qty 2

## 2020-06-29 MED ORDER — MAGNESIUM CITRATE PO SOLN: 1.0000 | Freq: Once | ORAL | Status: DC | PRN

## 2020-06-29 MED ORDER — ROPIVACAINE HCL 7.5 MG/ML IJ SOLN
INTRAMUSCULAR | Status: DC | PRN
  Administered 2020-06-29: 20 mL via PERINEURAL

## 2020-06-29 MED ORDER — BUPIVACAINE-EPINEPHRINE (PF) 0.25% -1:200000 IJ SOLN
INTRAMUSCULAR | Status: AC
  Filled 2020-06-29: qty 30

## 2020-06-29 MED ORDER — ORAL CARE MOUTH RINSE: 15.0000 mL | Freq: Once | OROMUCOSAL | Status: AC

## 2020-06-29 MED ORDER — 0.9 % SODIUM CHLORIDE (POUR BTL) OPTIME
TOPICAL | Status: DC | PRN
  Administered 2020-06-29: 1000 mL

## 2020-06-29 MED ORDER — METOCLOPRAMIDE HCL 5 MG/ML IJ SOLN: 5.0000 mg | Freq: Three times a day (TID) | INTRAMUSCULAR | Status: DC | PRN

## 2020-06-29 MED ORDER — PHENYLEPHRINE HCL-NACL 20-0.9 MG/250ML-% IV SOLN
INTRAVENOUS | Status: DC | PRN
  Administered 2020-06-29: 25 ug/min via INTRAVENOUS

## 2020-06-29 MED ORDER — HYDROMORPHONE HCL 1 MG/ML IJ SOLN: 0.5000 mg | INTRAMUSCULAR | Status: DC | PRN

## 2020-06-29 MED ORDER — POLYETHYLENE GLYCOL 3350 17 G PO PACK
17.0000 g | PACK | Freq: Two times a day (BID) | ORAL | Status: DC
  Filled 2020-06-29 (×2): qty 1

## 2020-06-29 MED ORDER — SODIUM CHLORIDE (PF) 0.9 % IJ SOLN
INTRAMUSCULAR | Status: DC | PRN
  Administered 2020-06-29: 30 mL

## 2020-06-29 MED ORDER — BUPIVACAINE-EPINEPHRINE (PF) 0.25% -1:200000 IJ SOLN
INTRAMUSCULAR | Status: DC | PRN
  Administered 2020-06-29: 30 mL via PERINEURAL

## 2020-06-29 MED ORDER — AMLODIPINE BESYLATE 5 MG PO TABS
5.0000 mg | ORAL_TABLET | Freq: Every day | ORAL | Status: DC
  Administered 2020-06-30: 5 mg via ORAL
  Filled 2020-06-29: qty 1

## 2020-06-29 MED ORDER — ACETAMINOPHEN 10 MG/ML IV SOLN: 1000.0000 mg | Freq: Once | INTRAVENOUS | Status: DC | PRN

## 2020-06-29 MED ORDER — METHOCARBAMOL 500 MG IVPB - SIMPLE MED
500.0000 mg | Freq: Four times a day (QID) | INTRAVENOUS | Status: DC | PRN
  Filled 2020-06-29: qty 50

## 2020-06-29 MED ORDER — STERILE WATER FOR IRRIGATION IR SOLN
Status: DC | PRN
  Administered 2020-06-29: 1000 mL

## 2020-06-29 MED ORDER — ONDANSETRON HCL 4 MG PO TABS
4.0000 mg | ORAL_TABLET | Freq: Four times a day (QID) | ORAL | Status: DC | PRN
  Filled 2020-06-29: qty 1

## 2020-06-29 MED ORDER — DIPHENHYDRAMINE HCL 12.5 MG/5ML PO ELIX: 12.5000 mg | ORAL_SOLUTION | ORAL | Status: DC | PRN

## 2020-06-29 MED ORDER — MENTHOL 3 MG MT LOZG: 1.0000 | LOZENGE | OROMUCOSAL | Status: DC | PRN

## 2020-06-29 MED ORDER — DEXAMETHASONE SODIUM PHOSPHATE 10 MG/ML IJ SOLN
INTRAMUSCULAR | Status: AC
  Filled 2020-06-29: qty 1

## 2020-06-29 MED ORDER — CHLORHEXIDINE GLUCONATE 0.12 % MT SOLN
15.0000 mL | Freq: Once | OROMUCOSAL | Status: AC
  Administered 2020-06-29: 15 mL via OROMUCOSAL

## 2020-06-29 MED ORDER — NON FORMULARY: 20.0000 mg | Freq: Every day | Status: DC

## 2020-06-29 MED ORDER — BUPIVACAINE IN DEXTROSE 0.75-8.25 % IT SOLN
INTRATHECAL | Status: DC | PRN
  Administered 2020-06-29: 1.6 mL via INTRATHECAL

## 2020-06-29 MED ORDER — METOCLOPRAMIDE HCL 5 MG PO TABS
5.0000 mg | ORAL_TABLET | Freq: Three times a day (TID) | ORAL | Status: DC | PRN
  Filled 2020-06-29: qty 2

## 2020-06-29 MED ORDER — CLONAZEPAM 1 MG PO TABS
1.0000 mg | ORAL_TABLET | Freq: Two times a day (BID) | ORAL | Status: DC | PRN
  Administered 2020-06-29: 1 mg via ORAL
  Filled 2020-06-29: qty 1

## 2020-06-29 MED ORDER — OMEPRAZOLE 20 MG PO CPDR
20.0000 mg | DELAYED_RELEASE_CAPSULE | Freq: Every day | ORAL | Status: DC
  Administered 2020-06-30: 20 mg via ORAL
  Filled 2020-06-29: qty 1

## 2020-06-29 MED ORDER — AMISULPRIDE (ANTIEMETIC) 5 MG/2ML IV SOLN: 10.0000 mg | Freq: Once | INTRAVENOUS | Status: DC | PRN

## 2020-06-29 MED ORDER — CEFAZOLIN SODIUM-DEXTROSE 2-4 GM/100ML-% IV SOLN
2.0000 g | INTRAVENOUS | Status: AC
  Administered 2020-06-29: 2 g via INTRAVENOUS
  Filled 2020-06-29: qty 100

## 2020-06-29 MED ORDER — DEXAMETHASONE SODIUM PHOSPHATE 10 MG/ML IJ SOLN
10.0000 mg | Freq: Once | INTRAMUSCULAR | Status: AC
  Administered 2020-06-29: 10 mg via INTRAVENOUS

## 2020-06-29 MED ORDER — VANCOMYCIN HCL 1000 MG/200ML IV SOLN
1000.0000 mg | Freq: Once | INTRAVENOUS | Status: AC
  Administered 2020-06-29: 1000 mg via INTRAVENOUS
  Filled 2020-06-29: qty 200

## 2020-06-29 MED ORDER — DOCUSATE SODIUM 100 MG PO CAPS
100.0000 mg | ORAL_CAPSULE | Freq: Two times a day (BID) | ORAL | Status: DC
  Filled 2020-06-29 (×2): qty 1

## 2020-06-29 MED ORDER — BISACODYL 10 MG RE SUPP: 10.0000 mg | Freq: Every day | RECTAL | Status: DC | PRN

## 2020-06-29 MED ORDER — ACETAMINOPHEN 160 MG/5ML PO SOLN: 325.0000 mg | Freq: Once | ORAL | Status: DC | PRN

## 2020-06-29 MED ORDER — PROPOFOL 500 MG/50ML IV EMUL
INTRAVENOUS | Status: DC | PRN
  Administered 2020-06-29: 100 ug/kg/min via INTRAVENOUS

## 2020-06-29 MED ORDER — HYDROMORPHONE HCL 1 MG/ML IJ SOLN: 0.2500 mg | INTRAMUSCULAR | Status: DC | PRN

## 2020-06-29 MED ORDER — KETOROLAC TROMETHAMINE 30 MG/ML IJ SOLN
INTRAMUSCULAR | Status: DC | PRN
  Administered 2020-06-29: 30 mg

## 2020-06-29 MED ORDER — OXYCODONE HCL 5 MG PO TABS
10.0000 mg | ORAL_TABLET | ORAL | Status: DC | PRN
  Administered 2020-06-29 – 2020-06-30 (×4): 10 mg via ORAL
  Filled 2020-06-29 (×4): qty 2

## 2020-06-29 MED ORDER — PHENYLEPHRINE 40 MCG/ML (10ML) SYRINGE FOR IV PUSH (FOR BLOOD PRESSURE SUPPORT)
PREFILLED_SYRINGE | INTRAVENOUS | Status: DC | PRN
  Administered 2020-06-29 (×2): 100 ug via INTRAVENOUS

## 2020-06-29 MED ORDER — PHENYLEPHRINE HCL (PRESSORS) 10 MG/ML IV SOLN
INTRAVENOUS | Status: AC
  Filled 2020-06-29: qty 2

## 2020-06-29 MED ORDER — SODIUM CHLORIDE 0.9 % IR SOLN
Status: DC | PRN
  Administered 2020-06-29: 1000 mL

## 2020-06-29 MED ORDER — SODIUM CHLORIDE 0.9 % IV SOLN: INTRAVENOUS | Status: DC

## 2020-06-29 SURGICAL SUPPLY — 54 items
ATTUNE MED ANAT PAT 35 KNEE (Knees) ×2 IMPLANT
ATTUNE PSFEM LTSZ4 NARCEM KNEE (Femur) ×2 IMPLANT
ATTUNE PSRP INSR SZ4 6 KNEE (Insert) ×2 IMPLANT
BAG ZIPLOCK 12X15 (MISCELLANEOUS) IMPLANT
BASEPLATE TIBIAL ROTATING SZ 4 (Knees) ×2 IMPLANT
BLADE SAW SGTL 11.0X1.19X90.0M (BLADE) IMPLANT
BLADE SAW SGTL 13.0X1.19X90.0M (BLADE) ×2 IMPLANT
BLADE SURG SZ10 CARB STEEL (BLADE) ×4 IMPLANT
BNDG ELASTIC 6X10 VLCR STRL LF (GAUZE/BANDAGES/DRESSINGS) ×2 IMPLANT
BNDG ELASTIC 6X5.8 VLCR STR LF (GAUZE/BANDAGES/DRESSINGS) ×2 IMPLANT
BOWL SMART MIX CTS (DISPOSABLE) ×2 IMPLANT
CEMENT HV SMART SET (Cement) ×4 IMPLANT
COVER WAND RF STERILE (DRAPES) IMPLANT
CUFF TOURN SGL QUICK 34 (TOURNIQUET CUFF) ×1
CUFF TRNQT CYL 34X4.125X (TOURNIQUET CUFF) ×1 IMPLANT
DECANTER SPIKE VIAL GLASS SM (MISCELLANEOUS) ×4 IMPLANT
DERMABOND ADVANCED (GAUZE/BANDAGES/DRESSINGS) ×1
DERMABOND ADVANCED .7 DNX12 (GAUZE/BANDAGES/DRESSINGS) ×1 IMPLANT
DRAPE U-SHAPE 47X51 STRL (DRAPES) ×2 IMPLANT
DRESSING AQUACEL AG SP 3.5X10 (GAUZE/BANDAGES/DRESSINGS) ×1 IMPLANT
DRSG AQUACEL AG SP 3.5X10 (GAUZE/BANDAGES/DRESSINGS) ×2
DURAPREP 26ML APPLICATOR (WOUND CARE) ×4 IMPLANT
ELECT REM PT RETURN 15FT ADLT (MISCELLANEOUS) ×2 IMPLANT
GLOVE ECLIPSE 8.0 STRL XLNG CF (GLOVE) ×2 IMPLANT
GLOVE ORTHO TXT STRL SZ7.5 (GLOVE) ×2 IMPLANT
GLOVE SURG ENC MOIS LTX SZ6 (GLOVE) IMPLANT
GLOVE SURG UNDER POLY LF SZ6.5 (GLOVE) IMPLANT
GLOVE SURG UNDER POLY LF SZ7.5 (GLOVE) ×2 IMPLANT
GLOVE SURG UNDER POLY LF SZ8.5 (GLOVE) IMPLANT
GOWN STRL REUS W/TWL 2XL LVL3 (GOWN DISPOSABLE) IMPLANT
GOWN STRL REUS W/TWL LRG LVL3 (GOWN DISPOSABLE) ×2 IMPLANT
HANDPIECE INTERPULSE COAX TIP (DISPOSABLE) ×1
HOLDER FOLEY CATH W/STRAP (MISCELLANEOUS) IMPLANT
KIT TURNOVER KIT A (KITS) ×2 IMPLANT
MANIFOLD NEPTUNE II (INSTRUMENTS) ×2 IMPLANT
NDL SAFETY ECLIPSE 18X1.5 (NEEDLE) IMPLANT
NEEDLE HYPO 18GX1.5 SHARP (NEEDLE)
NS IRRIG 1000ML POUR BTL (IV SOLUTION) ×2 IMPLANT
PACK TOTAL KNEE CUSTOM (KITS) ×2 IMPLANT
PENCIL SMOKE EVACUATOR (MISCELLANEOUS) IMPLANT
PIN DRILL FIX HALF THREAD (BIT) ×2 IMPLANT
PIN FIX SIGMA LCS THRD HI (PIN) ×2 IMPLANT
PROTECTOR NERVE ULNAR (MISCELLANEOUS) ×2 IMPLANT
SET HNDPC FAN SPRY TIP SCT (DISPOSABLE) ×1 IMPLANT
SET PAD KNEE POSITIONER (MISCELLANEOUS) ×2 IMPLANT
SUT MNCRL AB 4-0 PS2 18 (SUTURE) ×2 IMPLANT
SUT STRATAFIX PDS+ 0 24IN (SUTURE) ×2 IMPLANT
SUT VIC AB 1 CT1 36 (SUTURE) ×2 IMPLANT
SUT VIC AB 2-0 CT1 27 (SUTURE) ×3
SUT VIC AB 2-0 CT1 TAPERPNT 27 (SUTURE) ×3 IMPLANT
SYR 3ML LL SCALE MARK (SYRINGE) ×2 IMPLANT
TRAY FOLEY MTR SLVR 16FR STAT (SET/KITS/TRAYS/PACK) ×2 IMPLANT
WATER STERILE IRR 1000ML POUR (IV SOLUTION) ×4 IMPLANT
WRAP KNEE MAXI GEL POST OP (GAUZE/BANDAGES/DRESSINGS) ×2 IMPLANT

## 2020-06-29 NOTE — Interval H&P Note (Signed)
History and Physical Interval Note:  06/29/2020 9:03 AM  Amber Snyder  has presented today for surgery, with the diagnosis of Left knee osteoarthritis.  The various methods of treatment have been discussed with the patient and family. After consideration of risks, benefits and other options for treatment, the patient has consented to  Procedure(s) with comments: TOTAL KNEE ARTHROPLASTY (Left) - as a surgical intervention.  The patient's history has been reviewed, patient examined, no change in status, stable for surgery.  I have reviewed the patient's chart and labs.  Questions were answered to the patient's satisfaction.     Shelda Pal

## 2020-06-29 NOTE — Anesthesia Postprocedure Evaluation (Signed)
Anesthesia Post Note  Patient: KARLEA MCKIBBIN  Procedure(s) Performed: TOTAL KNEE ARTHROPLASTY (Left Knee)     Patient location during evaluation: PACU Anesthesia Type: Spinal Level of consciousness: oriented and awake and alert Pain management: pain level controlled Vital Signs Assessment: post-procedure vital signs reviewed and stable Respiratory status: spontaneous breathing, respiratory function stable and patient connected to nasal cannula oxygen Cardiovascular status: blood pressure returned to baseline and stable Postop Assessment: no headache, no backache and no apparent nausea or vomiting Anesthetic complications: no   No complications documented.  Last Vitals:  Vitals:   06/29/20 1430 06/29/20 1445  BP: 132/68 91/78  Pulse: 65 61  Resp: 13 (!) 8  Temp:  37.1 C  SpO2: 100% 100%    Last Pain:  Vitals:   06/29/20 1300  TempSrc:   PainSc: 0-No pain                 Shelton Silvas

## 2020-06-29 NOTE — Anesthesia Procedure Notes (Signed)
Spinal  Start time: 06/29/2020 10:24 AM End time: 06/29/2020 10:26 AM Staffing Performed: anesthesiologist  Anesthesiologist: Shelton Silvas, MD Preanesthetic Checklist Completed: patient identified, IV checked, site marked, risks and benefits discussed, surgical consent, monitors and equipment checked, pre-op evaluation and timeout performed Spinal Block Patient position: sitting Prep: DuraPrep and site prepped and draped Location: L3-4 Injection technique: single-shot Needle Needle type: Pencan  Needle gauge: 24 G Needle length: 10 cm Needle insertion depth: 10 cm Additional Notes Patient tolerated well. No immediate complications.

## 2020-06-29 NOTE — Discharge Instructions (Signed)

## 2020-06-29 NOTE — Evaluation (Signed)
Physical Therapy Evaluation Patient Details Name: Amber Snyder MRN: [id number] DOB: 1950-11-20 Today's Date: 06/29/2020   History of Present Illness  patient is a 70 y.o. 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  Clinical Impression  Pt is a 69y.o. female s/p Lt TKA POD 0. Pt reports that she is independent with mobility at baseline. Pt required MIN guard and verbal cues for sit to stand transfer from EOB. Pt required MIN guard for ambulation 1ft with verbal cues for RW management and step to gait pattern with no LOB. PT reviewed therapeutic interventions for promotion of DVT prevention with pt. Pt will be staying with her daughter while recovering and have assistance from family. Recommend home with family support. Pt will benefit from skilled PT to increase independence and safety with mobility. Acute therapy to follow up during stay.      Follow Up Recommendations Follow surgeon's recommendation for DC plan and follow-up therapies;Home health PT    Equipment Recommendations  None recommended by PT (pt has RW at home)    Recommendations for Other Services       Precautions / Restrictions Precautions Precautions: Fall Restrictions Weight Bearing Restrictions: No Other Position/Activity Restrictions: WBAT      Mobility  Bed Mobility Overal bed mobility: Needs Assistance Bed Mobility: Supine to Sit     Supine to sit: Supervision;HOB elevated     General bed mobility comments: pt with use of handrail and B UEs to scoot to EOB with supervision for safety and assist with line management.    Transfers Overall transfer level: Needs assistance Equipment used: Rolling walker (2 wheeled) Transfers: Sit to/from Stand Sit to Stand: Min guard         General transfer comment: MIN guard for safety with cues for safe hand placement  and WBAT status on Lt LE.  Ambulation/Gait Ambulation/Gait assistance: Min guard Gait Distance (Feet): 30 Feet Assistive device: Rolling walker (2 wheeled) Gait Pattern/deviations: Step-to pattern;Decreased stride length;Decreased weight shift to left Gait velocity: decr   General Gait Details: MIN guard for safety with cues for RW management and step to gait pattern.  Stairs            Wheelchair Mobility    Modified Rankin (Stroke Patients Only)       Balance Overall balance assessment: Needs assistance Sitting-balance support: Feet supported Sitting balance-Leahy Scale: Good     Standing balance support: Bilateral upper extremity supported;During functional activity Standing balance-Leahy Scale: Poor Standing balance comment: use of external support                             Pertinent Vitals/Pain Pain Assessment: 0-10 Pain Score: 5  Pain Location: Lt knee Pain Descriptors / Indicators: Aching;Discomfort;Sore;Tender Pain Intervention(s): Limited activity within patient's tolerance;Monitored during session;Repositioned    Home Living Family/patient expects to be discharged to:: Private residence Living Arrangements: Children Available Help at Discharge: Family Type of Home: House Home Access: Ramped entrance     Home Layout: One level Home Equipment: Environmental consultant - 2 wheels;Cane - single point;Bedside commode;Shower seat Additional Comments: pt will be staying at daughter's house while recovering and have assistance from family 24/7.    Prior Function Level of Independence: Independent               Hand Dominance  Dominant Hand: Right    Extremity/Trunk Assessment   Upper Extremity Assessment Upper Extremity Assessment: Overall WFL for tasks assessed    Lower Extremity Assessment Lower Extremity Assessment: LLE deficits/detail LLE Deficits / Details: pt with good quad set strength and 4/5 B dorsi/plantar flexion strength. Pt able  to complete full SLR with no extensor lag. LLE Sensation: WNL LLE Coordination: WNL    Cervical / Trunk Assessment Cervical / Trunk Assessment: Normal  Communication   Communication: No difficulties  Cognition Arousal/Alertness: Awake/alert Behavior During Therapy: WFL for tasks assessed/performed Overall Cognitive Status: Within Functional Limits for tasks assessed                                        General Comments      Exercises Total Joint Exercises Ankle Circles/Pumps: AROM;Both;15 reps;Seated   Assessment/Plan    PT Assessment Patient needs continued PT services  PT Problem List Decreased strength;Decreased range of motion;Decreased activity tolerance;Decreased balance;Decreased mobility;Decreased knowledge of use of DME;Pain       PT Treatment Interventions DME instruction;Gait training;Functional mobility training;Therapeutic exercise;Therapeutic activities;Balance training;Patient/family education    PT Goals (Current goals can be found in the Care Plan section)  Acute Rehab PT Goals Patient Stated Goal: none stated PT Goal Formulation: With patient Time For Goal Achievement: 07/06/20 Potential to Achieve Goals: Good    Frequency 7X/week   Barriers to discharge        Co-evaluation               AM-PAC PT "6 Clicks" Mobility  Outcome Measure Help needed turning from your back to your side while in a flat bed without using bedrails?: None Help needed moving from lying on your back to sitting on the side of a flat bed without using bedrails?: A Little Help needed moving to and from a bed to a chair (including a wheelchair)?: A Little Help needed standing up from a chair using your arms (e.g., wheelchair or bedside chair)?: A Little Help needed to walk in hospital room?: A Little Help needed climbing 3-5 steps with a railing? : A Little 6 Click Score: 19    End of Session Equipment Utilized During Treatment: Gait belt Activity  Tolerance: Patient tolerated treatment well Patient left: in chair;with call bell/phone within reach;with chair alarm set Nurse Communication: Mobility status PT Visit Diagnosis: Unsteadiness on feet (R26.81);Pain;Muscle weakness (generalized) (M62.81) Pain - Right/Left: Right Pain - part of body: Knee    Time: 6720-9470 PT Time Calculation (min) (ACUTE ONLY): 24 min   Charges:              Loyal Gambler, SPT  Acute rehab    Loyal Gambler 06/29/2020, 7:20 PM

## 2020-06-29 NOTE — Op Note (Addendum)
NAME:  Amber Snyder                      MEDICAL RECORD NO.:  [id number]                             FACILITY:  [facility]      PHYSICIAN:  Madlyn Frankel. Charlann Boxer, M.D.  DATE OF BIRTH:  July 17, 1950      DATE OF PROCEDURE:  06/29/2020                                     OPERATIVE REPORT         PREOPERATIVE DIAGNOSIS:  Left knee osteoarthritis.      POSTOPERATIVE DIAGNOSIS:  Left knee osteoarthritis.      FINDINGS:  The patient was noted to have complete loss of cartilage and   bone-on-bone arthritis with associated osteophytes in the medial and patellofemoral compartments of   the knee with a significant synovitis and associated effusion.  The patient had failed months of conservative treatment including medications, injection therapy, activity modification.     PROCEDURE:  Left total knee replacement.      COMPONENTS USED:  DePuy Attune rotating platform posterior stabilized knee   system, a size 4N femur, 4 tibia, size 6 mm PS AOX insert, and 35 anatomic patellar   button.      SURGEON:  Madlyn Frankel. Charlann Boxer, M.D.      ASSISTANT:  Dennie Bible, PA-C.      ANESTHESIA:  Regional and Spinal.      SPECIMENS:  None.      COMPLICATION:  None.      DRAINS:  None.  EBL: <100 cc      TOURNIQUET TIME:   Total Tourniquet Time Documented: Thigh (Left) - 32 minutes Total: Thigh (Left) - 32 minutes  .      The patient was stable to the recovery room.      INDICATION FOR PROCEDURE:  Amber Snyder is a 70 y.o. female patient of   mine.  The patient had been seen, evaluated, and treated for months conservatively in the   office with medication, activity modification, and injections.  The patient had   radiographic changes of bone-on-bone arthritis with endplate sclerosis and osteophytes noted.  Based on the radiographic changes and failed conservative measures, the patient   decided to proceed with definitive treatment, total knee replacement.  Risks of infection, DVT, component failure,  need for revision surgery, neurovascular injury were reviewed in the office setting.  The postop course was reviewed stressing the efforts to maximize post-operative satisfaction and function.  Consent was obtained for benefit of pain   relief.      PROCEDURE IN DETAIL:  The patient was brought to the operative theater.   Once adequate anesthesia, preoperative antibiotics, 2 gm of Ancef,1 gm of Tranexamic Acid, and 10 mg of Decadron administered, the patient was positioned supine with a left thigh tourniquet placed.  The  left lower extremity was prepped and draped in sterile fashion.  A time-   out was performed identifying the patient, planned procedure, and the appropriate extremity.      The left lower extremity was placed in the [facility] leg holder.  The leg was   exsanguinated, tourniquet elevated to 250 mmHg.  A midline incision was  made followed by median parapatellar arthrotomy.  Following initial   exposure, attention was first directed to the patella.  Precut   measurement was noted to be 21 mm.  I resected down to 13 mm and used a   35 anatomic patellar button to restore patellar height as well as cover the cut surface.      The lug holes were drilled and a metal shim was placed to protect the   patella from retractors and saw blade during the procedure.      At this point, attention was now directed to the femur.  The femoral   canal was opened with a drill, irrigated to try to prevent fat emboli.  An   intramedullary rod was passed at 3 degrees valgus, 8 mm of bone was   resected off the distal femur due to pre-operative hyperextension.  Following this resection, the tibia was   subluxated anteriorly.  Using the extramedullary guide, 2 mm of bone was resected off   the proximal medial tibia.  We confirmed the gap would be   stable medially and laterally with a size 5 spacer block as well as confirmed that the tibial cut was perpendicular in the coronal plane, checking with an  alignment rod.      Once this was done, I sized the femur to be a size 4 in the anterior-   posterior dimension, chose a narrow component based on medial and   lateral dimension.  The size 4 rotation block was then pinned in   position anterior referenced using the C-clamp to set rotation.  The   anterior, posterior, and  chamfer cuts were made without difficulty nor   notching making certain that I was along the anterior cortex to help   with flexion gap stability.      The final box cut was made off the lateral aspect of distal femur.      At this point, the tibia was sized to be a size 4.  The size 4 tray was   then pinned in position through the medial third of the tubercle,   drilled, and keel punched.  Trial reduction was now carried with a 4 femur,  4 tibia, a size 6 mm PS insert, and the 35 anatomic patella botton.  The knee was brought to full extension with good flexion stability with the patella   tracking through the trochlea without application of pressure.  Given   all these findings the trial components removed.  Final components were   opened and cement was mixed.  The knee was irrigated with normal saline solution and pulse lavage.  The synovial lining was   then injected with 30 cc of 0.25% Marcaine with epinephrine, 1 cc of Toradol and 30 cc of NS for a total of 61 cc.     Final implants were then cemented onto cleaned and dried cut surfaces of bone with the knee brought to extension with a size 6 mm PS trial insert.      Once the cement had fully cured, excess cement was removed   throughout the knee.  I confirmed that I was satisfied with the range of   motion and stability, and the final size 6 mm PS AOX insert was chosen.  It was   placed into the knee.      The tourniquet had been let down at 32 minutes.  No significant   hemostasis was required.  The extensor mechanism was then  reapproximated using #1 Vicryl and #1 Stratafix sutures with the knee   in flexion.   The   remaining wound was closed with 2-0 Vicryl and running 4-0 Monocryl.   The knee was cleaned, dried, dressed sterilely using Dermabond and   Aquacel dressing.  The patient was then   brought to recovery room in stable condition, tolerating the procedure   well.   Please note that Physician Assistant, Dennie Bible, PA-C was present for the entirety of the case, and was utilized for pre-operative positioning, peri-operative retractor management, general facilitation of the procedure and for primary wound closure at the end of the case.              Madlyn Frankel Charlann Boxer, M.D.    06/29/2020 11:46 AM

## 2020-06-29 NOTE — Progress Notes (Signed)
Assisted Dr. Shona Simpson with left Knee adductor canal block. Side rails up, monitors on throughout procedure. See vital signs in flow sheet. Tolerated Procedure well.

## 2020-06-29 NOTE — Plan of Care (Signed)
Plan of care reviewed and discussed with the patient. 

## 2020-06-29 NOTE — Anesthesia Preprocedure Evaluation (Addendum)
Anesthesia Evaluation  Patient identified by MRN, date of birth, ID band Patient awake    Reviewed: Allergy & Precautions, NPO status , Patient's Chart, lab work & pertinent test results  Airway Mallampati: II  TM Distance: >3 FB Neck ROM: Full    Dental  (+) Teeth Intact, Dental Advisory Given   Pulmonary neg pulmonary ROS,    Pulmonary exam normal        Cardiovascular hypertension, Pt. on medications + Valvular Problems/Murmurs  Rhythm:Regular Rate:Normal     Neuro/Psych PSYCHIATRIC DISORDERS Anxiety Depression  Neuromuscular disease    GI/Hepatic Neg liver ROS, hiatal hernia, GERD  Medicated,  Endo/Other  negative endocrine ROS  Renal/GU negative Renal ROS     Musculoskeletal  (+) Arthritis , Fibromyalgia -  Abdominal Normal abdominal exam  (+)   Peds  Hematology negative hematology ROS (+)   Anesthesia Other Findings   Reproductive/Obstetrics                            Anesthesia Physical Anesthesia Plan  ASA: II  Anesthesia Plan: Spinal   Post-op Pain Management:  Regional for Post-op pain   Induction: Intravenous  PONV Risk Score and Plan: 3 and Ondansetron, Dexamethasone and Propofol infusion  Airway Management Planned: Natural Airway and Simple Face Mask  Additional Equipment: None  Intra-op Plan:   Post-operative Plan:   Informed Consent: I have reviewed the patients History and Physical, chart, labs and discussed the procedure including the risks, benefits and alternatives for the proposed anesthesia with the patient or authorized representative who has indicated his/her understanding and acceptance.     Dental advisory given  Plan Discussed with: CRNA  Anesthesia Plan Comments: (Lab Results      Component                Value               Date                      WBC                      6.3                 06/24/2020                HGB                       14.9                06/24/2020                HCT                      44.6                06/24/2020                MCV                      85.4                06/24/2020                PLT                      276  06/24/2020           )      Anesthesia Quick Evaluation

## 2020-06-29 NOTE — Transfer of Care (Signed)
Immediate Anesthesia Transfer of Care Note  Patient: Amber Snyder  Procedure(s) Performed: TOTAL KNEE ARTHROPLASTY (Left Knee)  Patient Location: PACU  Anesthesia Type:MAC, Regional and MAC combined with regional for post-op pain  Level of Consciousness: awake, sedated and patient cooperative  Airway & Oxygen Therapy: Patient Spontanous Breathing and Patient connected to face mask oxygen  Post-op Assessment: Report given to RN and Post -op Vital signs reviewed and stable  Post vital signs: stable  Last Vitals:  Vitals Value Taken Time  BP 106/61 06/29/20 1210  Temp    Pulse 76 06/29/20 1211  Resp 15 06/29/20 1211  SpO2 93 % 06/29/20 1211  Vitals shown include unvalidated device data.  Last Pain:  Vitals:   06/29/20 0754  TempSrc:   PainSc: 0-No pain         Complications: No complications documented.

## 2020-06-29 NOTE — Anesthesia Procedure Notes (Signed)
Anesthesia Regional Block: Adductor canal block   Pre-Anesthetic Checklist: ,, timeout performed, Correct Patient, Correct Site, Correct Laterality, Correct Procedure, Correct Position, site marked, Risks and benefits discussed,  Surgical consent,  Pre-op evaluation,  At surgeon's request and post-op pain management  Laterality: Left  Prep: chloraprep       Needles:  Injection technique: Single-shot  Needle Type: Echogenic Stimulator Needle     Needle Length: 9cm  Needle Gauge: 21     Additional Needles:   Procedures:,,,, ultrasound used (permanent image in chart),,,,  Narrative:  Start time: 06/29/2020 9:32 AM End time: 06/29/2020 9:37 AM Injection made incrementally with aspirations every 5 mL.  Performed by: Personally  Anesthesiologist: Shelton Silvas, MD  Additional Notes: Patient tolerated the procedure well. Local anesthetic introduced in an incremental fashion under minimal resistance after negative aspirations. No paresthesias were elicited. After completion of the procedure, no acute issues were identified and patient continued to be monitored by RN.

## 2020-06-30 ENCOUNTER — Encounter (HOSPITAL_COMMUNITY): Payer: Self-pay | Admitting: Orthopedic Surgery

## 2020-06-30 DIAGNOSIS — M1712 Unilateral primary osteoarthritis, left knee: Secondary | ICD-10-CM | POA: Diagnosis not present

## 2020-06-30 DIAGNOSIS — E669 Obesity, unspecified: Secondary | ICD-10-CM | POA: Diagnosis present

## 2020-06-30 LAB — CBC
HCT: 40.1 % (ref 36.0–46.0)
Hemoglobin: 13.4 g/dL (ref 12.0–15.0)
MCH: 28.6 pg (ref 26.0–34.0)
MCHC: 33.4 g/dL (ref 30.0–36.0)
MCV: 85.7 fL (ref 80.0–100.0)
Platelets: 244 10*3/uL (ref 150–400)
RBC: 4.68 MIL/uL (ref 3.87–5.11)
RDW: 13.7 % (ref 11.5–15.5)
WBC: 12.6 10*3/uL — ABNORMAL HIGH (ref 4.0–10.5)
nRBC: 0 % (ref 0.0–0.2)

## 2020-06-30 LAB — BASIC METABOLIC PANEL
Anion gap: 10 (ref 5–15)
BUN: 18 mg/dL (ref 8–23)
CO2: 25 mmol/L (ref 22–32)
Calcium: 8.6 mg/dL — ABNORMAL LOW (ref 8.9–10.3)
Chloride: 104 mmol/L (ref 98–111)
Creatinine, Ser: 0.84 mg/dL (ref 0.44–1.00)
GFR, Estimated: >60 mL/min (ref >60)
Glucose, Bld: 139 mg/dL — ABNORMAL HIGH (ref 70–99)
Potassium: 3.8 mmol/L (ref 3.5–5.1)
Sodium: 139 mmol/L (ref 135–145)

## 2020-06-30 MED ORDER — DOCUSATE SODIUM 100 MG PO CAPS: 100.0000 mg | ORAL_CAPSULE | Freq: Two times a day (BID) | ORAL | 0 refills | Status: AC

## 2020-06-30 MED ORDER — ACETAMINOPHEN 500 MG PO TABS: 1000.0000 mg | ORAL_TABLET | Freq: Three times a day (TID) | ORAL | 0 refills | Status: AC

## 2020-06-30 MED ORDER — OXYCODONE HCL 5 MG PO TABS: 5.0000 mg | ORAL_TABLET | ORAL | 0 refills | Status: AC | PRN

## 2020-06-30 MED ORDER — POLYETHYLENE GLYCOL 3350 17 G PO PACK: 17.0000 g | PACK | Freq: Two times a day (BID) | ORAL | 0 refills | Status: AC

## 2020-06-30 MED ORDER — ASPIRIN 81 MG PO CHEW: 81.0000 mg | CHEWABLE_TABLET | Freq: Two times a day (BID) | ORAL | 0 refills | Status: AC

## 2020-06-30 MED ORDER — FERROUS SULFATE 325 (65 FE) MG PO TABS: 325.0000 mg | ORAL_TABLET | Freq: Three times a day (TID) | ORAL | 0 refills | Status: AC

## 2020-06-30 MED ORDER — METHOCARBAMOL 500 MG PO TABS: 500.0000 mg | ORAL_TABLET | Freq: Four times a day (QID) | ORAL | 0 refills | Status: AC | PRN

## 2020-06-30 NOTE — TOC Transition Note (Signed)
Transition of Care [facility]) - CM/SW Discharge Note   Patient Details  Name: Amber Snyder MRN: [id number] Date of Birth: 1950/06/21  Transition of Care [facility]) CM/SW Contact:  Clearance Coots, LCSW Phone Number: 06/30/2020, 11:04 AM   Clinical Narrative:    Prearranged Therapy Plan: OPPT  Patient confirm she has a RW and will borrow a 3 in1.  (Pt. Insurance paid for a 3 IN1 less than 5 years go and pt. Does not want to pay privately through [employer]). No other needs identified.   Final next level of care: OP Rehab Barriers to Discharge: No Barriers Identified   Patient Goals and CMS Choice        Discharge Placement                       Discharge Plan and Services                                     Social Determinants of Health (SDOH) Interventions     Readmission Risk Interventions No flowsheet data found.
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
## 2020-06-30 NOTE — Progress Notes (Addendum)
     Subjective: 1 Day Post-Op Procedure(s) (LRB): TOTAL KNEE ARTHROPLASTY (Left)   Patient reports pain as mild, pain controlled. No reported events throughout the night.  Discussed the procedure and expectations moving forward.  Ready to be discharged home, if they do well with PT.  Follow up in the clinic in 2 weeks.  Knows to call with any questions or concerns.     Patient's anticipated LOS is less than 2 midnights, meeting these requirements: - Younger than 52 - Lives within 1 hour of care - Has a competent adult at home to recover with post-op recover - NO history of  - Chronic pain requiring opiods  - Diabetes  - Coronary Artery Disease  - Heart failure  - Heart attack  - Stroke  - DVT/VTE  - Cardiac arrhythmia  - Respiratory Failure/COPD  - Renal failure  - Anemia  - Advanced Liver disease       Objective:   VITALS:   Vitals:   06/30/20 0117 06/30/20 0553  BP: 110/62 126/62  Pulse: 66 66  Resp: 18 18  Temp: 97.6 F (36.4 C) (!) 97.4 F (36.3 C)  SpO2: 95% 97%    Dorsiflexion/Plantar flexion intact Incision: dressing C/D/I No cellulitis present Compartment soft  LABS Recent Labs    06/30/20 0328  HGB 13.4  HCT 40.1  WBC 12.6*  PLT 244    Recent Labs    06/30/20 0328  NA 139  K 3.8  BUN 18  CREATININE 0.84  GLUCOSE 139*     Assessment/Plan: 1 Day Post-Op Procedure(s) (LRB): TOTAL KNEE ARTHROPLASTY (Left) Foley cath d/c'ed Advance diet Up with therapy D/C IV fluids Discharge home Follow up in 2 weeks at [facility] Follow up with OLIN,Gurney Balthazor D in 2 weeks.  Contact information:  [facility] [address], [address] [place] [address] [phone]    Obese (BMI 30-39.9) Estimated body mass index is 38.97 kg/m as calculated from the following:   Height as of this encounter: 5\' 3"  (1.6 m).   Weight as of this encounter: 99.8 kg. Patient also counseled that weight may inhibit the healing process Patient  counseled that losing weight will help with future health issues        PA-C  [facility]  [place] [address], [address], [place], [place] Phone: [phone] [email or web address] Facebook  [phone]

## 2020-06-30 NOTE — Plan of Care (Signed)
Patient discharged home in stable condition 

## 2020-06-30 NOTE — Progress Notes (Signed)
Physical Therapy Treatment Patient Details Name: ADILENY DELON MRN: [id number] DOB: [birth year] Today's Date: 06/30/2020    History of Present Illness patient is a 70 y.o. 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    PT Comments    Pt progressing toward acute PT goals with increased ambulation distance with MIN guard progressing to supervision for safety and cues for step to gait pattern. PT educated pt on importance of taking therapeutic rest breaks when needed for pain management to maintain safety during gait, pt agreeable. PT reviewed LE HEP with no complaints of increased pain or fatigue. Pt is at safe mobility level for discharge to daughter's house and will have 24/7 assistance from family.  Acute therapy to follow up during stay.       Follow Up Recommendations  Follow surgeon's recommendation for DC plan and follow-up therapies;Outpatient PT     Equipment Recommendations  None recommended by PT (pt owns RW)    Recommendations for Other Services       Precautions / Restrictions Precautions Precautions: Fall Restrictions Weight Bearing Restrictions: No Other Position/Activity Restrictions: WBAT    Mobility  Bed Mobility Overal bed mobility: Modified Independent Bed Mobility: Supine to Sit     Supine to sit: HOB elevated;Modified independent (Device/Increase time)     General bed mobility comments: pt with use of handrail and B UEs to scoot to EOB.    Transfers Overall transfer level: Needs assistance Equipment used: Rolling walker (2 wheeled) Transfers: Sit to/from Stand Sit to Stand: Min guard         General transfer comment: MIN guard for safety. Pt was able to demonstrate safe hand placement with use of RW for transfer.  Ambulation/Gait Ambulation/Gait assistance: Min guard;Supervision Gait Distance  (Feet): 160 Feet Assistive device: Rolling walker (2 wheeled) Gait Pattern/deviations: Step-to pattern;Decreased stride length;Decreased weight shift to left Gait velocity: fair   General Gait Details: MIN guard progressing to supervision for safety with cues for step to gait pattern. Pt displayed sligthly irradic path with RW intermittently when having increased discomfort with no LOB. PT educated pt on taking standing rest breaks when needed for pain mangement. Pt took 1 standing rest break.   Stairs             Wheelchair Mobility    Modified Rankin (Stroke Patients Only)       Balance Overall balance assessment: Needs assistance Sitting-balance support: Feet supported Sitting balance-Leahy Scale: Good     Standing balance support: Bilateral upper extremity supported;During functional activity Standing balance-Leahy Scale: Poor Standing balance comment: use of external support                            Cognition Arousal/Alertness: Awake/alert Behavior During Therapy: WFL for tasks assessed/performed Overall Cognitive Status: Within Functional Limits for tasks assessed                                        Exercises Total Joint Exercises Ankle Circles/Pumps: AROM;Both;20 reps;Seated Quad Sets: AROM;Left;5 reps;Seated Short Arc Quad: AROM;Left;5 reps;Seated Heel Slides: AROM;Left;5 reps;Seated Hip ABduction/ADduction: AROM;Left;5 reps;Seated Straight Leg Raises: AROM;Left;5 reps;Seated Long Arc Quad: AROM;Left;5 reps;Seated    General Comments        Pertinent  Vitals/Pain Pain Assessment: 0-10 Pain Score: 5  Pain Location: Lt knee Pain Descriptors / Indicators: Sore Pain Intervention(s): Limited activity within patient's tolerance;Monitored during session;Repositioned;Ice applied    Home Living                      Prior Function            PT Goals (current goals can now be found in the care plan section)  Acute Rehab PT Goals Patient Stated Goal: continue to progress to regain independence PT Goal Formulation: With patient Time For Goal Achievement: 07/06/20 Potential to Achieve Goals: Good Progress towards PT goals: Progressing toward goals    Frequency    7X/week      PT Plan Current plan remains appropriate    Co-evaluation              AM-PAC PT "6 Clicks" Mobility   Outcome Measure  Help needed turning from your back to your side while in a flat bed without using bedrails?: None Help needed moving from lying on your back to sitting on the side of a flat bed without using bedrails?: A Little Help needed moving to and from a bed to a chair (including a wheelchair)?: A Little Help needed standing up from a chair using your arms (e.g., wheelchair or bedside chair)?: A Little Help needed to walk in hospital room?: A Little Help needed climbing 3-5 steps with a railing? : A Little 6 Click Score: 19    End of Session Equipment Utilized During Treatment: Gait belt Activity Tolerance: Patient tolerated treatment well Patient left: in chair;with call bell/phone within reach;with chair alarm set Nurse Communication: Mobility status PT Visit Diagnosis: Unsteadiness on feet (R26.81);Pain;Muscle weakness (generalized) (M62.81) Pain - Right/Left: Left Pain - part of body: Knee     Time: 1107-1130 PT Time Calculation (min) (ACUTE ONLY): 23 min  Charges:                        Loyal Gambler, SPT  Acute rehab     Lauren Youngblood 06/30/2020, 11:48 AM

## 2020-09-27 IMAGING — CR DG CERVICAL SPINE 2 OR 3 VIEWS
2 series · 2 of 2 positions shown · non-contrast
Comparison: None.

CLINICAL DATA: Surgery.

EXAM:
CERVICAL SPINE - 2-3 VIEW

[xtable lateral (1 of 2)]
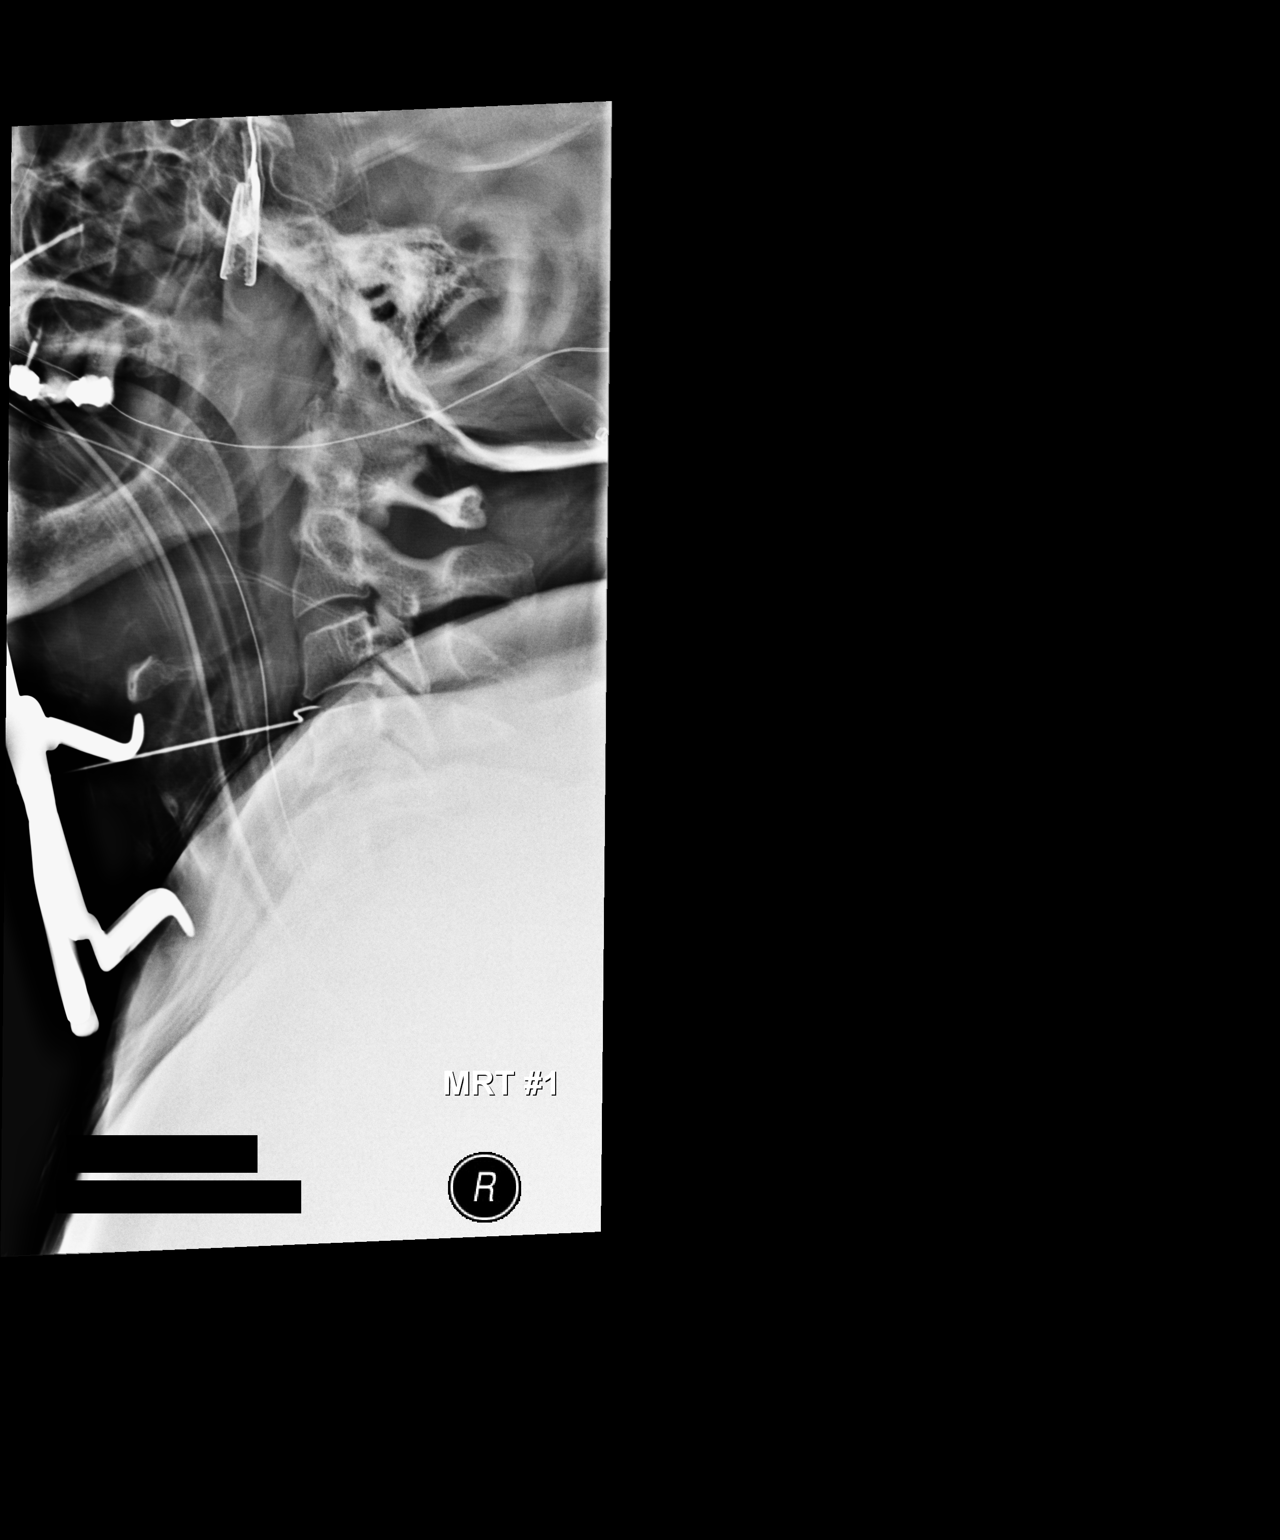

[xtable lateral (2 of 2)]
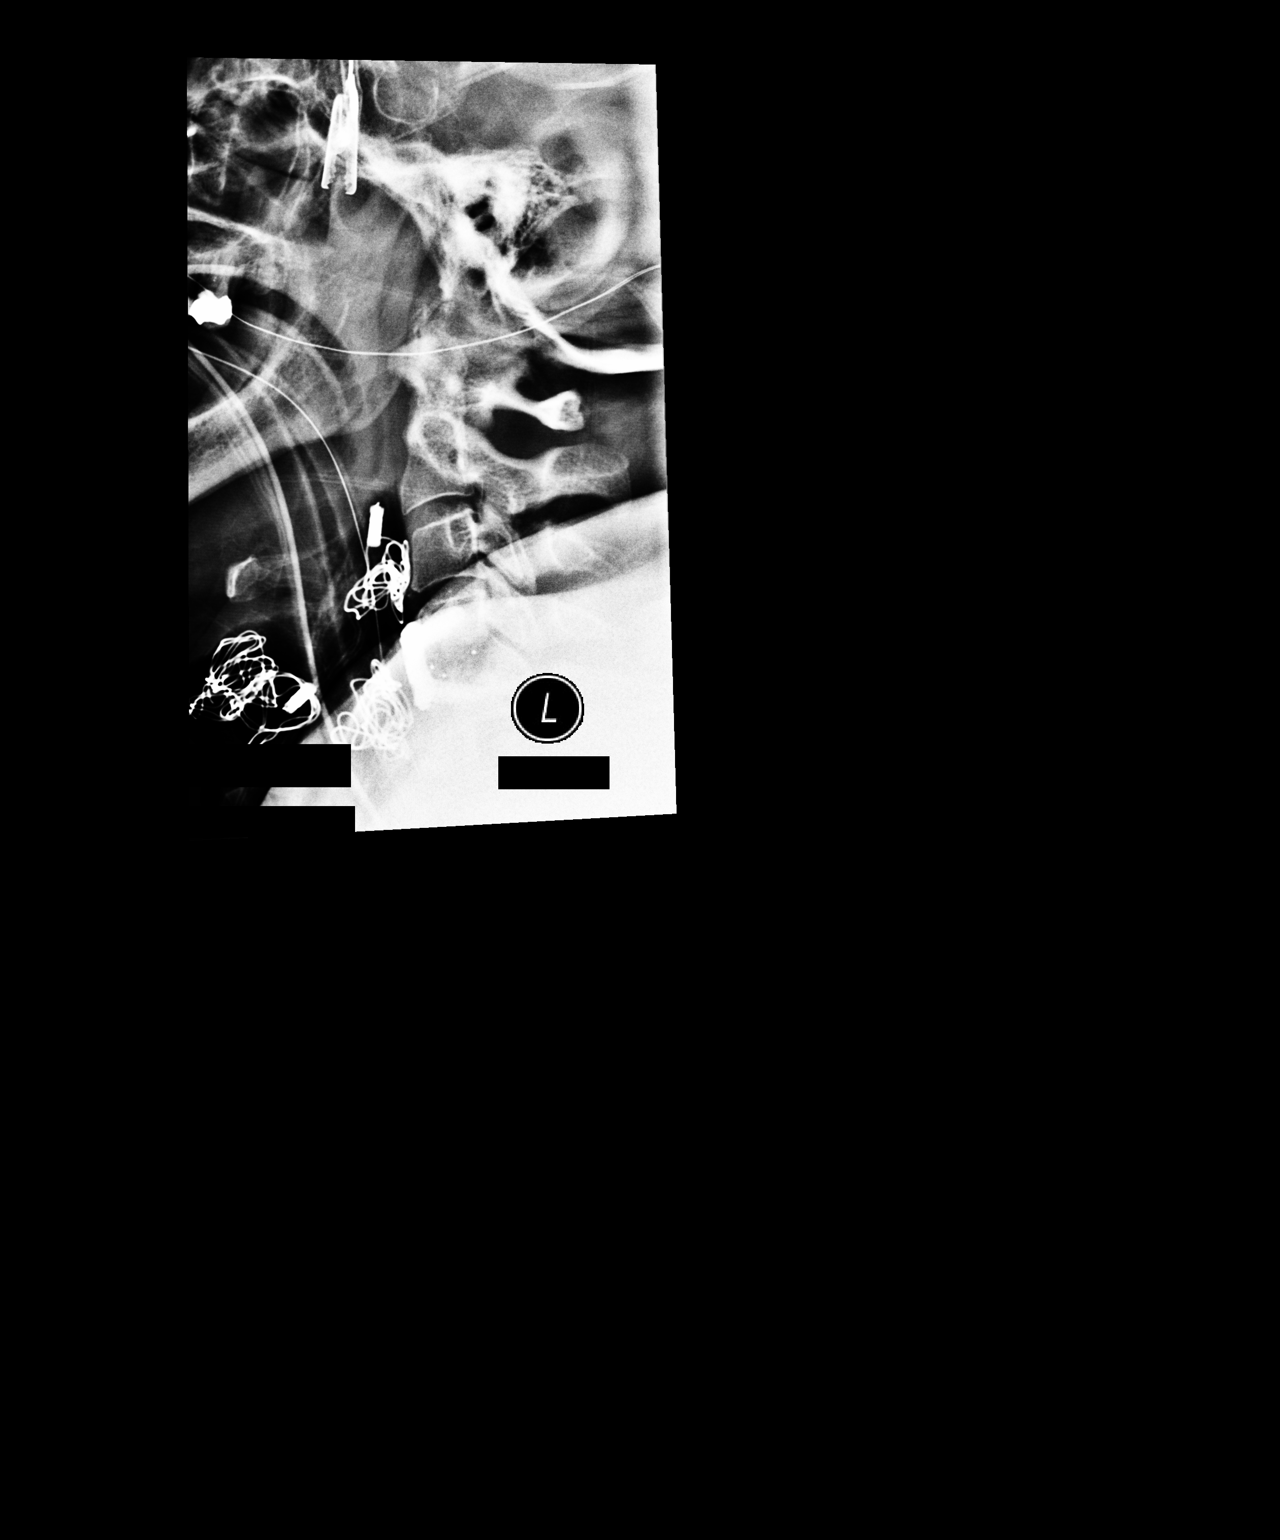

[2 of 2 positions shown; findings below may reference images not displayed]

FINDINGS: A surgical marker seen anteriorly at C3-4 on the initial image.
Anterior plate and screw have been placed at C4 and C5 by the end of
the study. There may be a disc spacer device at C4-5 which is not
well assessed on this study.
IMPRESSION: Anterior plate screw placed at C4 and C5. I believe there is a disc
spacer device at this same level which is not well assessed but
appears to be in place.
